# Patient Record
Sex: Female | Born: 1940 | Race: Black or African American | Hispanic: No | Smoking: Never smoker
Health system: Southern US, Community
[De-identification: ages and names within clinical notes are randomized; demographics above are authoritative.]

## PROBLEM LIST (undated history)

## (undated) DIAGNOSIS — F0390 Unspecified dementia without behavioral disturbance: Secondary | ICD-10-CM

## (undated) DIAGNOSIS — I10 Essential (primary) hypertension: Secondary | ICD-10-CM

## (undated) DIAGNOSIS — I639 Cerebral infarction, unspecified: Secondary | ICD-10-CM

## (undated) HISTORY — PX: MASTECTOMY: SHX3

---

## 2005-10-24 ENCOUNTER — Encounter (INDEPENDENT_AMBULATORY_CARE_PROVIDER_SITE_OTHER): Payer: Self-pay | Admitting: *Deleted

## 2005-10-24 LAB — CONVERTED CEMR LAB

## 2006-04-07 ENCOUNTER — Ambulatory Visit: Payer: Self-pay | Admitting: Family Medicine

## 2006-05-09 ENCOUNTER — Encounter: Payer: Self-pay | Admitting: Family Medicine

## 2006-05-09 ENCOUNTER — Ambulatory Visit: Payer: Self-pay | Admitting: Family Medicine

## 2006-05-09 LAB — CONVERTED CEMR LAB
Calcium: 9.5 mg/dL (ref 8.4–10.5)
Sodium: 138 meq/L (ref 135–145)

## 2006-06-23 DIAGNOSIS — I10 Essential (primary) hypertension: Secondary | ICD-10-CM

## 2006-06-23 DIAGNOSIS — D509 Iron deficiency anemia, unspecified: Secondary | ICD-10-CM

## 2006-06-24 ENCOUNTER — Encounter (INDEPENDENT_AMBULATORY_CARE_PROVIDER_SITE_OTHER): Payer: Self-pay | Admitting: *Deleted

## 2006-06-28 ENCOUNTER — Ambulatory Visit: Payer: Self-pay | Admitting: Family Medicine

## 2006-06-28 DIAGNOSIS — S7400XA Injury of sciatic nerve at hip and thigh level, unspecified leg, initial encounter: Secondary | ICD-10-CM

## 2006-07-05 ENCOUNTER — Telehealth: Payer: Self-pay | Admitting: *Deleted

## 2006-07-07 ENCOUNTER — Ambulatory Visit: Payer: Self-pay | Admitting: Sports Medicine

## 2006-07-07 DIAGNOSIS — M1710 Unilateral primary osteoarthritis, unspecified knee: Secondary | ICD-10-CM | POA: Insufficient documentation

## 2006-08-02 ENCOUNTER — Ambulatory Visit: Payer: Self-pay | Admitting: Sports Medicine

## 2006-08-02 LAB — CONVERTED CEMR LAB: Hgb A1c MFr Bld: 6.7 %

## 2006-09-20 ENCOUNTER — Ambulatory Visit: Payer: Self-pay | Admitting: Sports Medicine

## 2006-09-20 DIAGNOSIS — E119 Type 2 diabetes mellitus without complications: Secondary | ICD-10-CM | POA: Insufficient documentation

## 2006-09-20 DIAGNOSIS — Z8639 Personal history of other endocrine, nutritional and metabolic disease: Secondary | ICD-10-CM | POA: Insufficient documentation

## 2006-09-20 LAB — CONVERTED CEMR LAB
Albumin: 4.1 g/dL (ref 3.5–5.2)
BUN: 13 mg/dL (ref 6–23)
CO2: 28 meq/L (ref 19–32)
Calcium: 9.4 mg/dL (ref 8.4–10.5)
Chloride: 103 meq/L (ref 96–112)
Cholesterol: 170 mg/dL (ref 0–200)
Creatinine, Ser: 1.07 mg/dL (ref 0.40–1.20)
HDL: 37 mg/dL — ABNORMAL LOW (ref >39)
Hemoglobin: 10.5 g/dL
Total CHOL/HDL Ratio: 4.6
WBC: 5.6 10*3/uL

## 2006-09-27 ENCOUNTER — Encounter (INDEPENDENT_AMBULATORY_CARE_PROVIDER_SITE_OTHER): Payer: Self-pay | Admitting: Sports Medicine

## 2006-10-12 ENCOUNTER — Telehealth (INDEPENDENT_AMBULATORY_CARE_PROVIDER_SITE_OTHER): Payer: Self-pay | Admitting: Sports Medicine

## 2006-10-13 ENCOUNTER — Telehealth: Payer: Self-pay | Admitting: *Deleted

## 2006-10-14 ENCOUNTER — Ambulatory Visit (HOSPITAL_COMMUNITY): Admission: RE | Admit: 2006-10-14 | Discharge: 2006-10-14 | Payer: Self-pay | Admitting: Family Medicine

## 2006-10-14 ENCOUNTER — Ambulatory Visit: Payer: Self-pay | Admitting: Family Medicine

## 2006-10-27 ENCOUNTER — Encounter: Payer: Self-pay | Admitting: Family Medicine

## 2006-12-05 ENCOUNTER — Encounter (INDEPENDENT_AMBULATORY_CARE_PROVIDER_SITE_OTHER): Payer: Self-pay | Admitting: *Deleted

## 2007-01-23 ENCOUNTER — Ambulatory Visit: Payer: Self-pay | Admitting: Family Medicine

## 2007-01-23 LAB — CONVERTED CEMR LAB: Hgb A1c MFr Bld: 6.4 %

## 2007-03-01 ENCOUNTER — Ambulatory Visit: Payer: Self-pay | Admitting: Family Medicine

## 2007-03-01 LAB — CONVERTED CEMR LAB
ALT: 9 units/L (ref 0–35)
Albumin: 3.9 g/dL (ref 3.5–5.2)
BUN: 15 mg/dL (ref 6–23)
CO2: 26 meq/L (ref 19–32)
Calcium: 9.3 mg/dL (ref 8.4–10.5)
Chloride: 100 meq/L (ref 96–112)
Creatinine, Ser: 1.09 mg/dL (ref 0.40–1.20)
Potassium: 4 meq/L (ref 3.5–5.3)

## 2007-03-06 ENCOUNTER — Encounter: Payer: Self-pay | Admitting: Family Medicine

## 2007-03-15 ENCOUNTER — Telehealth: Payer: Self-pay | Admitting: Family Medicine

## 2007-03-28 ENCOUNTER — Encounter: Payer: Self-pay | Admitting: Family Medicine

## 2007-03-29 ENCOUNTER — Encounter: Payer: Self-pay | Admitting: *Deleted

## 2007-03-30 ENCOUNTER — Telehealth (INDEPENDENT_AMBULATORY_CARE_PROVIDER_SITE_OTHER): Payer: Self-pay | Admitting: *Deleted

## 2007-05-03 ENCOUNTER — Ambulatory Visit: Payer: Self-pay | Admitting: Family Medicine

## 2007-05-03 LAB — CONVERTED CEMR LAB: Hgb A1c MFr Bld: 7.1 %

## 2007-07-26 ENCOUNTER — Ambulatory Visit: Payer: Self-pay | Admitting: Family Medicine

## 2007-07-26 LAB — CONVERTED CEMR LAB
BUN: 13 mg/dL (ref 6–23)
Calcium: 9.4 mg/dL (ref 8.4–10.5)
Chloride: 104 meq/L (ref 96–112)
Creatinine, Ser: 0.9 mg/dL (ref 0.40–1.20)

## 2007-07-28 ENCOUNTER — Encounter: Payer: Self-pay | Admitting: Family Medicine

## 2007-08-11 ENCOUNTER — Ambulatory Visit (HOSPITAL_COMMUNITY): Admission: RE | Admit: 2007-08-11 | Discharge: 2007-08-11 | Payer: Self-pay | Admitting: Family Medicine

## 2007-08-22 ENCOUNTER — Encounter: Admission: RE | Admit: 2007-08-22 | Discharge: 2007-08-22 | Payer: Self-pay | Admitting: Family Medicine

## 2008-01-03 ENCOUNTER — Encounter: Payer: Self-pay | Admitting: *Deleted

## 2008-03-26 ENCOUNTER — Encounter: Payer: Self-pay | Admitting: Family Medicine

## 2008-04-23 ENCOUNTER — Encounter: Payer: Self-pay | Admitting: Family Medicine

## 2013-01-23 ENCOUNTER — Other Ambulatory Visit: Payer: Self-pay | Admitting: Internal Medicine

## 2013-01-23 DIAGNOSIS — I69919 Unspecified symptoms and signs involving cognitive functions following unspecified cerebrovascular disease: Secondary | ICD-10-CM

## 2013-02-05 ENCOUNTER — Telehealth: Payer: Self-pay | Admitting: *Deleted

## 2013-02-05 NOTE — Telephone Encounter (Signed)
Left message for Francee Piccolo at referring to let him know that I am unable to reach the patient and requested for some direction on another number or another way of contacting her.

## 2013-02-22 ENCOUNTER — Ambulatory Visit
Admission: RE | Admit: 2013-02-22 | Discharge: 2013-02-22 | Disposition: A | Discharge status: Home / Self Care | Payer: Medicare Other | Source: Ambulatory Visit | Attending: Internal Medicine | Admitting: Internal Medicine

## 2013-02-22 DIAGNOSIS — I69919 Unspecified symptoms and signs involving cognitive functions following unspecified cerebrovascular disease: Secondary | ICD-10-CM

## 2013-04-05 ENCOUNTER — Telehealth: Payer: Self-pay | Admitting: Hematology and Oncology

## 2013-04-05 NOTE — Telephone Encounter (Signed)
Called pt to schedule np appt. Not able to leave message [phone] not able to leave message [phone]-D/C [phone] no message left.

## 2015-02-05 ENCOUNTER — Other Ambulatory Visit: Payer: Self-pay | Admitting: Internal Medicine

## 2015-02-05 DIAGNOSIS — Z9889 Other specified postprocedural states: Secondary | ICD-10-CM

## 2015-02-05 DIAGNOSIS — Z853 Personal history of malignant neoplasm of breast: Secondary | ICD-10-CM

## 2020-03-03 ENCOUNTER — Encounter (HOSPITAL_COMMUNITY): Payer: Self-pay | Admitting: Emergency Medicine

## 2020-03-03 ENCOUNTER — Emergency Department (HOSPITAL_COMMUNITY): Payer: Medicare (Managed Care)

## 2020-03-03 ENCOUNTER — Emergency Department (HOSPITAL_COMMUNITY)
Admission: EM | Admit: 2020-03-03 | Discharge: 2020-03-03 | Disposition: A | Discharge status: Home / Self Care | Payer: Medicare (Managed Care) | Attending: Emergency Medicine | Admitting: Emergency Medicine

## 2020-03-03 DIAGNOSIS — I639 Cerebral infarction, unspecified: Secondary | ICD-10-CM | POA: Insufficient documentation

## 2020-03-03 DIAGNOSIS — R0989 Other specified symptoms and signs involving the circulatory and respiratory systems: Secondary | ICD-10-CM | POA: Insufficient documentation

## 2020-03-03 DIAGNOSIS — F0390 Unspecified dementia without behavioral disturbance: Secondary | ICD-10-CM | POA: Insufficient documentation

## 2020-03-03 DIAGNOSIS — I10 Essential (primary) hypertension: Secondary | ICD-10-CM | POA: Diagnosis not present

## 2020-03-03 DIAGNOSIS — E119 Type 2 diabetes mellitus without complications: Secondary | ICD-10-CM | POA: Insufficient documentation

## 2020-03-03 DIAGNOSIS — Z20822 Contact with and (suspected) exposure to covid-19: Secondary | ICD-10-CM | POA: Diagnosis not present

## 2020-03-03 HISTORY — DX: Cerebral infarction, unspecified: I63.9

## 2020-03-03 HISTORY — DX: Unspecified dementia, unspecified severity, without behavioral disturbance, psychotic disturbance, mood disturbance, and anxiety: F03.90

## 2020-03-03 HISTORY — DX: Essential (primary) hypertension: I10

## 2020-03-03 LAB — BASIC METABOLIC PANEL
Anion gap: 12 (ref 5–15)
BUN: 26 mg/dL — ABNORMAL HIGH (ref 8–23)
CO2: 18 mmol/L — ABNORMAL LOW (ref 22–32)
Calcium: 8.3 mg/dL — ABNORMAL LOW (ref 8.9–10.3)
Chloride: 109 mmol/L (ref 98–111)
Creatinine, Ser: 1.26 mg/dL — ABNORMAL HIGH (ref 0.44–1.00)
GFR, Estimated: 44 mL/min — ABNORMAL LOW (ref >60)
Glucose, Bld: 105 mg/dL — ABNORMAL HIGH (ref 70–99)
Potassium: 4.3 mmol/L (ref 3.5–5.1)
Sodium: 139 mmol/L (ref 135–145)

## 2020-03-03 LAB — RESPIRATORY PANEL BY RT PCR (FLU A&B, COVID)
Influenza A by PCR: NEGATIVE
Influenza B by PCR: NEGATIVE
SARS Coronavirus 2 by RT PCR: NEGATIVE

## 2020-03-03 LAB — CBC WITH DIFFERENTIAL/PLATELET
Abs Immature Granulocytes: 0.03 10*3/uL (ref 0.00–0.07)
Basophils Absolute: 0 10*3/uL (ref 0.0–0.1)
Basophils Relative: 0 %
Eosinophils Absolute: 0.1 10*3/uL (ref 0.0–0.5)
Eosinophils Relative: 2 %
HCT: 29.1 % — ABNORMAL LOW (ref 36.0–46.0)
Hemoglobin: 8.1 g/dL — ABNORMAL LOW (ref 12.0–15.0)
Immature Granulocytes: 1 %
Lymphocytes Relative: 12 %
Lymphs Abs: 0.7 10*3/uL (ref 0.7–4.0)
MCH: 24.3 pg — ABNORMAL LOW (ref 26.0–34.0)
MCHC: 27.8 g/dL — ABNORMAL LOW (ref 30.0–36.0)
MCV: 87.1 fL (ref 80.0–100.0)
Monocytes Absolute: 0.4 10*3/uL (ref 0.1–1.0)
Monocytes Relative: 6 %
Neutro Abs: 4.9 10*3/uL (ref 1.7–7.7)
Neutrophils Relative %: 79 %
Platelets: 198 10*3/uL (ref 150–400)
RBC: 3.34 MIL/uL — ABNORMAL LOW (ref 3.87–5.11)
RDW: 15.1 % (ref 11.5–15.5)
WBC: 6.1 10*3/uL (ref 4.0–10.5)
nRBC: 0 % (ref 0.0–0.2)

## 2020-03-03 NOTE — ED Provider Notes (Signed)
Emergency Department Provider Note   I have reviewed the triage vital signs and the nursing notes.   HISTORY  Chief Complaint Choking   HPI Sandra Jennings is a 79 y.o. female Dementia, HTN, and prior CVA presents to the ED with choking while eating dinner tonight.  The patient has a history of dementia and history is limited based on this.  Level 5 caveat applies.  The patient's son is at bedside and provides most of the history.  He tells me that they were eating a rice with beef tips tonight and the patient was eating alone.  They heard her gasping and choking and went to assist her.  She spit out the food that was in her mouth but then continued to spit her saliva out.  She did not appear short of breath.  Ultimately, they decided to call EMS for evaluation where she continued to spit out her saliva in route.     Past Medical History:  Diagnosis Date  . Dementia (HCC)   . Hypertension   . Stroke [facility])     Patient Active Problem List   Diagnosis Date Noted  . DIABETES MELLITUS, TYPE II 09/20/2006  . DEGENERATIVE JOINT DISEASE, KNEES, BILATERAL 07/07/2006  . 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 ANEMIA, IRON DEFICIENCY, UNSPEC. 06/23/2006  . HYPERTENSION, BENIGN SYSTEMIC 06/23/2006   Allergies Patient has no allergy information on record.  No family history on file.  Social History Social History   Tobacco Use  . Smoking status: Not on file  Substance Use Topics  . Alcohol use: Not on file  . Drug use: Not on file    Review of Systems  Level 5 caveat: Dementia   ____________________________________________   PHYSICAL EXAM:  VITAL SIGNS: ED Triage Vitals  Enc Vitals Group     BP 03/03/20 1837 (!) 151/81     Pulse Rate 03/03/20 1837 78     Resp 03/03/20 1837 17     Temp 03/03/20 1837 98.4 F (36.9 C)     Temp Source 03/03/20 1837 Oral     SpO2 03/03/20 1837 100 %     Weight 03/03/20 1838 257 lb 15 oz (117 kg)     Height 03/03/20 1838 5\' 3"  (1.6 m)    Constitutional: Alert but unable to provide significant history. Well appearing and in no acute distress. Eyes: Conjunctivae are normal. Head: Atraumatic. Nose: No congestion/rhinnorhea. Mouth/Throat: Mucous membranes are moist.  Oropharynx is widely patent with no pooling secretions. Neck: No stridor.  Cardiovascular: Normal rate, regular rhythm. Good peripheral circulation. Grossly normal heart sounds.   Respiratory: Normal respiratory effort.  No retractions. Lungs CTAB. Gastrointestinal: No distention.  Musculoskeletal: No gross deformities of extremities. Neurologic:  Normal speech and language. Skin:  Skin is warm, dry and intact. No rash noted.   ____________________________________________   LABS (all labs ordered are listed, but only abnormal results are displayed)  Labs Reviewed  BASIC METABOLIC PANEL - Abnormal; Notable for the following components:      Result Value   CO2 18 (*)    Glucose, Bld 105 (*)    BUN 26 (*)    Creatinine, Ser 1.26 (*)    Calcium 8.3 (*)    GFR, Estimated 44 (*)    All other components within normal limits  CBC WITH DIFFERENTIAL/PLATELET - Abnormal; Notable for the following components:   RBC 3.34 (*)    Hemoglobin 8.1 (*)    HCT 29.1 (*)    [facility]  24.3 (*)    MCHC 27.8 (*)    All other components within normal limits  RESPIRATORY PANEL BY RT PCR (FLU A&B, COVID)   ____________________________________________  RADIOLOGY  DG Chest 2 View  Result Date: 03/03/2020 CLINICAL DATA:  Choking. EXAM: CHEST - 2 VIEW COMPARISON:  None. FINDINGS: The lungs are clear without focal pneumonia, edema, pneumothorax or pleural effusion. Interstitial markings are diffusely coarsened with chronic features. Cardiopericardial silhouette is at upper limits of normal for size. Bones are diffusely demineralized. IMPRESSION: No active cardiopulmonary disease. Electronically Signed   By: Kennith Center M.D.   On: 03/03/2020 19:10     ____________________________________________   PROCEDURES  Procedure(s) performed:   Procedures  None  ____________________________________________   INITIAL IMPRESSION / ASSESSMENT AND PLAN / ED COURSE  Pertinent labs & imaging results that were available during my care of the patient were reviewed by me and considered in my medical decision making (see chart for details).   Patient presents to the emergency department with clinical picture consistent with esophageal food impaction.  She is in no respiratory distress at this time.  She does have a emesis bag with her at the bedside which is filled mainly with saliva.  Plan for chest x-ray and lab work and will discuss with GI.   CXR without acute findings. Patient drinking water here without difficulty. No vomiting or spitting. Will continue to observe pending labs. Will try apple sauce consistency food.   Patient eating and drinking without difficulty.  No vomiting or spitting up.  Suspect that she had a food impaction which cleared.  Discussed with the son that she may have some underlying esophageal strictures and may benefit from evaluation by gastroenterology.  Provided the clinic contact information for the practice on-call.  Advised the patient also follow closely with her primary care doctor.  In the meantime she will stick with soft foods and liquids primarily. Discussed ED return precautions.   Lab work reviewed showing creatinine and BUN elevated from her prior values but those are from over 10 years ago.  Patient does not appear acutely dehydrated.  She is drinking fluids and plans to follow with the primary care doctor.  Covid negative.  ____________________________________________  FINAL CLINICAL IMPRESSION(S) / ED DIAGNOSES  Final diagnoses:  Choking episode     Note:  This document was prepared using Dragon voice recognition software and may include unintentional dictation errors.  Alona Bene, MD,  [facility]    Silva Aamodt, Arlyss Repress, MD 03/03/20 2106

## 2020-03-03 NOTE — ED Triage Notes (Signed)
Pt arrives via EMS from home with hx of HTN and dementia. Pt was eating dinner and possible choked on rice and beef. EMS reports pt constantly spit up white frothy sputum en route.

## 2020-03-03 NOTE — Discharge Instructions (Signed)
You were seen in the ED with a choking episode. This was likely from food that got temporarily stuck in your esophagus and then went down on its own. This could mean that you have an area of narrowing in your esophagus. You should follow up with your PCP but also a gastroenterologist. I have listed the name of someone here. Return to the ED with any new or worsening symptoms. Please eat soft foods that are easy to chew until cleared by your PCP or Gastroenterology doctor to do otherwise.

## 2020-03-03 NOTE — ED Notes (Signed)
Pt tolerating a cup of water without any coughing or difficult maintaining fluids.

## 2020-03-03 NOTE — ED Notes (Signed)
Vital signs stable. 

## 2020-03-03 NOTE — ED Notes (Signed)
Pt tolerated applesauce without difficulty

## 2020-04-29 ENCOUNTER — Encounter: Payer: Self-pay | Admitting: Gastroenterology

## 2020-04-29 ENCOUNTER — Ambulatory Visit (INDEPENDENT_AMBULATORY_CARE_PROVIDER_SITE_OTHER): Payer: Medicare (Managed Care) | Admitting: Gastroenterology

## 2020-04-29 VITALS — BP 132/72 | HR 81 | Ht 65.0 in | Wt 124.0 lb

## 2020-04-29 DIAGNOSIS — F0390 Unspecified dementia without behavioral disturbance: Secondary | ICD-10-CM | POA: Diagnosis not present

## 2020-04-29 DIAGNOSIS — T17308D Unspecified foreign body in larynx causing other injury, subsequent encounter: Secondary | ICD-10-CM

## 2020-04-29 DIAGNOSIS — R1310 Dysphagia, unspecified: Secondary | ICD-10-CM | POA: Diagnosis not present

## 2020-04-29 NOTE — Progress Notes (Signed)
HPI: This is a very pleasant 80 year old woman who was referred to me by [employer]  to evaluate an episode of choking, dysphagia.    Sandra Jennings is demented, she is unable to provide any history.  She is here with her son today who she lives with.  Her son is very aware of her various health issues.  She has been demented for years and has been slowly losing weight, the doctors feel it is from her dementia.  She gets around by walking with a lot of assist only.  She is unaware of where she is.  Her son describes a choking event early November that is well documented by the emergency room visit summarized below.  Prior to that choking event and since the choking event her son and her son's wife have never suspected that she is having any trouble with her swallowing.  She drinks 1 can of Ensure every day, she eats solid breakfast sandwiches without assist.  She is eating solid food otherwise without any obvious dysphagia or swallowing issues or nausea or vomiting but her family that lives with her can tell   Old Data Reviewed: She had a choking event March 03, 2020 while eating dinner at her care facility.  She was eating rice and beef tips.  Then she was gasping and choking.  She was eventually brought to the emergency room for evaluation.  In the emergency room they felt that she had "clinical picture consistent with esophageal food impaction" she was in no respiratory distress at that time but was having some difficulty with saliva.  Chest x-ray was normal.  She was eventually drinking water without difficulty and then applesauce and then food.  All of this went down well so she was sent home.  She was told that she "may benefit from evaluation by gastroenterology"  The appt was set up by [facility]   Review of systems: Pertinent positive and negative review of systems were noted in the above HPI section. All other review negative.   Past Medical History:  Diagnosis Date  . Dementia  (HCC)   . Hypertension   . Stroke [facility])     Past Surgical History:  Procedure Laterality Date  . MASTECTOMY Bilateral     Current Outpatient Medications  Medication Sig Dispense Refill  . acetaminophen (TYLENOL) 325 MG tablet Take 650 mg by mouth 3 (three) times daily as needed.    . Iron-Vitamin C (VITRON-C) 65-125 MG TABS Take 1 tablet by mouth daily at 2 PM.    . lisinopril (ZESTRIL) 20 MG tablet Take 20 mg by mouth daily.    . QUEtiapine (SEROQUEL) 100 MG tablet Take 100 mg by mouth 2 (two) times daily.    . sertraline (ZOLOFT) 25 MG tablet Take 25 mg by mouth daily.    . traZODone (DESYREL) 100 MG tablet Take 200 mg by mouth at bedtime.     No current facility-administered medications for this visit.    Allergies as of 04/29/2020 - Review Complete 04/29/2020  Allergen Reaction Noted  . Ambien [zolpidem] Other (See Comments) 04/28/2020    Family History  Problem Relation Age of Onset  . Hypertension Son   . Stomach cancer Neg Hx   . Colon cancer Neg Hx   . Pancreatic cancer Neg Hx     Social History   Socioeconomic History  . Marital status: Widowed    Spouse name: Not on file  . Number of children: Not on  file  . Years of education: Not on file  . Highest education level: Not on file  Occupational History  . Not on file  Tobacco Use  . Smoking status: Never Smoker  . Smokeless tobacco: Never Used  Substance and Sexual Activity  . Alcohol use: Never  . Drug use: Never  . Sexual activity: Not on file  Other Topics Concern  . Not on file  Social History Narrative  . Not on file   Social Determinants of Health   Financial Resource Strain: Not on file  Food Insecurity: Not on file  Transportation Needs: Not on file  Physical Activity: Not on file  Stress: Not on file  Social Connections: Not on file  Intimate Partner Violence: Not on file     Physical Exam: Ht 5\' 5"  (1.651 m)   Wt 124 lb (56.2 kg)   BMI 20.63 kg/m  Constitutional: Demented,  sitting in a wheelchair Psychiatric: alert and oriented x0 Eyes: extraocular movements intact Mouth: oral pharynx moist, no lesions Neck: supple no lymphadenopathy Cardiovascular: heart regular rate and rhythm Lungs: clear to auscultation bilaterally Abdomen: soft, nontender, nondistended, no obvious ascites, no peritoneal signs, normal bowel sounds Extremities: no lower extremity edema bilaterally Skin: no lesions on visible extremities   Assessment and plan: 80 y.o. female with dementia, episode of choking in November  Her son and his wife live with Sandra Jennings and they have never suspected or noticed any sign of swallowing difficulty before this choking event in November or since the choking event.  She is able to eat solid food without any evident difficulty.  She drinks an Ensure every day.  Admittedly she has been losing weight gradually over years.  I explained that I doubt there is anything serious going on since the choking event was a single event only and she never had any before then or since then.  She has no evidence of dysphagia.  I think that she probably simply aspirated a bit of food that one night causing her issues.  She is 54, frail and elderly.  Her son and I agreed that he and his wife would watch for any sign of swallowing trouble and contact me if they feel she is having trouble.  Otherwise we would simply let this be without dedicated testing given the obvious difficulty she would have with any tests invasive or not.   Please see the "Patient Instructions" section for addition details about the plan.   71, MD [facility] 04/29/2020, 11:20 AM  Cc: [employer], 06/27/2020 A*  Total time on date of encounter was 45  minutes (this included time spent preparing to see the patient reviewing records; obtaining and/or reviewing separately obtained history; performing a medically appropriate exam and/or evaluation; counseling and educating the patient and  family if present; ordering medications, tests or procedures if applicable; and documenting clinical information in the health record).

## 2020-04-29 NOTE — Patient Instructions (Signed)
If you are age 79 or older, your body mass index should be between 23-30. Your Body mass index is 20.63 kg/m. If this is out of the aforementioned range listed, please consider follow up with your Primary Care Provider.  Family will contact our office at [phone] if they feel patient is having repeat episodes of trouble swallowing to make a follow up appointment.  Thank you for entrusting me with your care and choosing [facility].  Dr Christella Hartigan

## 2020-09-18 ENCOUNTER — Other Ambulatory Visit: Payer: Self-pay | Admitting: Vascular Surgery

## 2020-09-18 ENCOUNTER — Ambulatory Visit
Admission: RE | Admit: 2020-09-18 | Discharge: 2020-09-18 | Disposition: A | Discharge status: Home / Self Care | Payer: Medicare (Managed Care) | Source: Ambulatory Visit | Attending: Vascular Surgery | Admitting: Vascular Surgery

## 2020-09-18 ENCOUNTER — Other Ambulatory Visit: Payer: Self-pay

## 2020-09-18 DIAGNOSIS — M79632 Pain in left forearm: Secondary | ICD-10-CM

## 2020-09-18 DIAGNOSIS — M25442 Effusion, left hand: Secondary | ICD-10-CM

## 2021-11-05 IMAGING — DX DG HAND COMPLETE 3+V*L*
4 series · 4 of 4 positions shown · non-contrast
Comparison: None.

CLINICAL DATA: Left hand pain and swelling.  Possible injury.

EXAM:
LEFT HAND - COMPLETE 3+ VIEW

[dg hand complete left (1 of 4)]
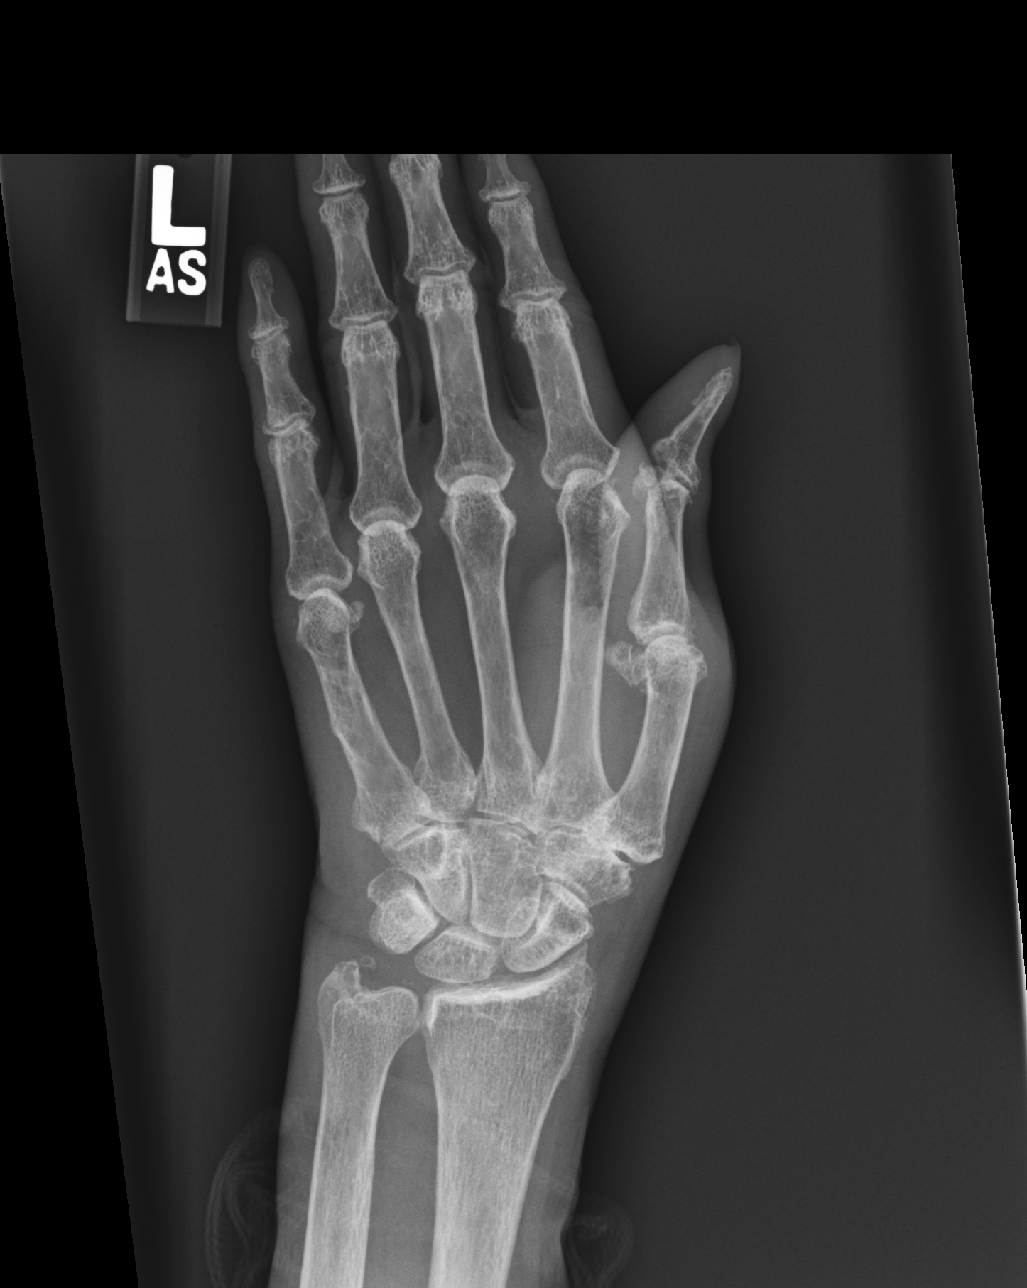

[dg hand complete left (2 of 4)]
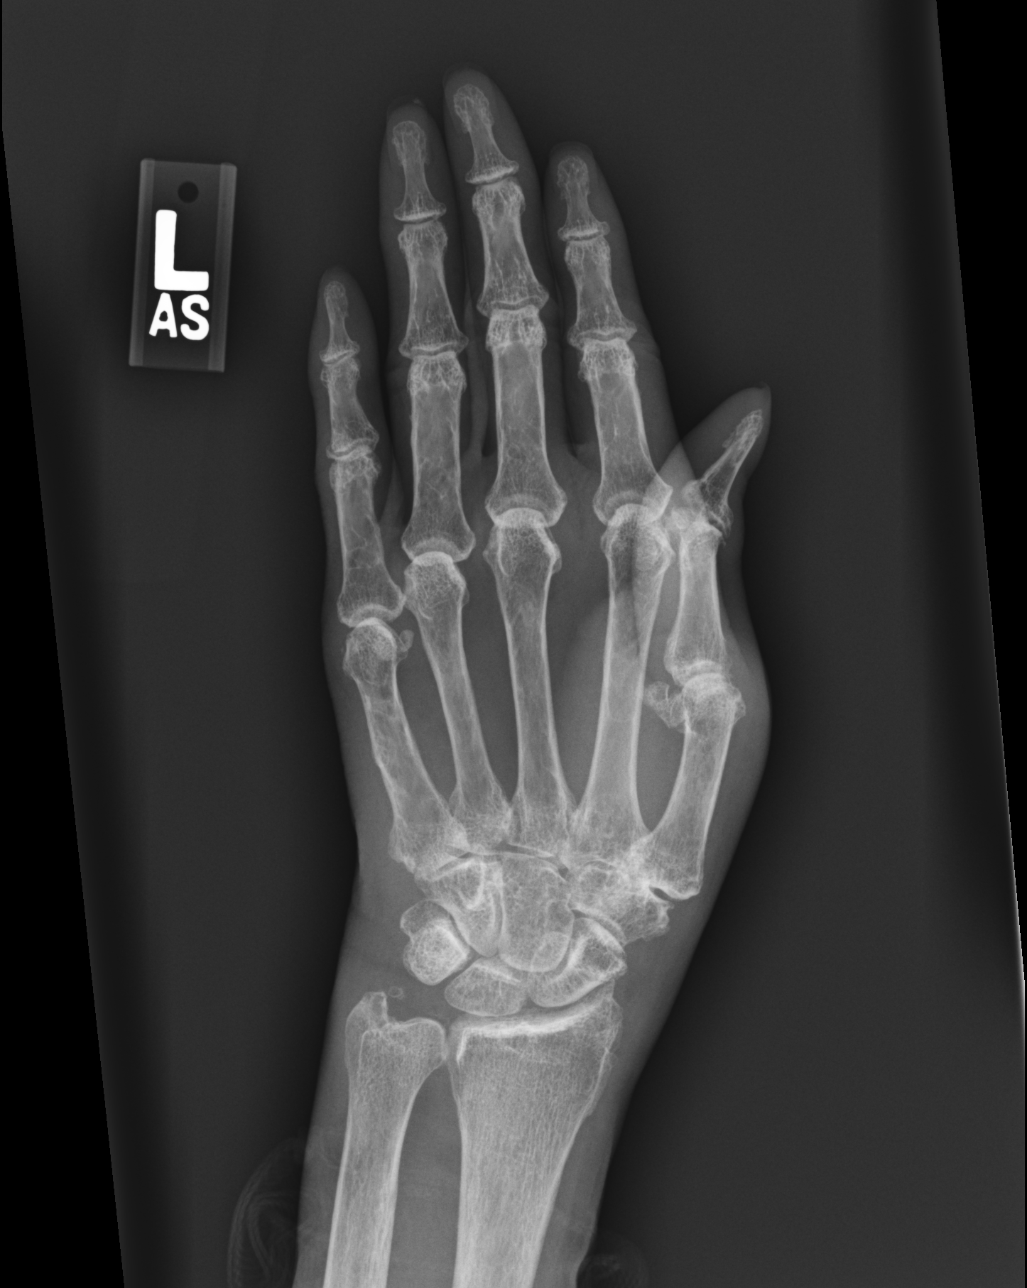

[dg hand complete left (3 of 4)]
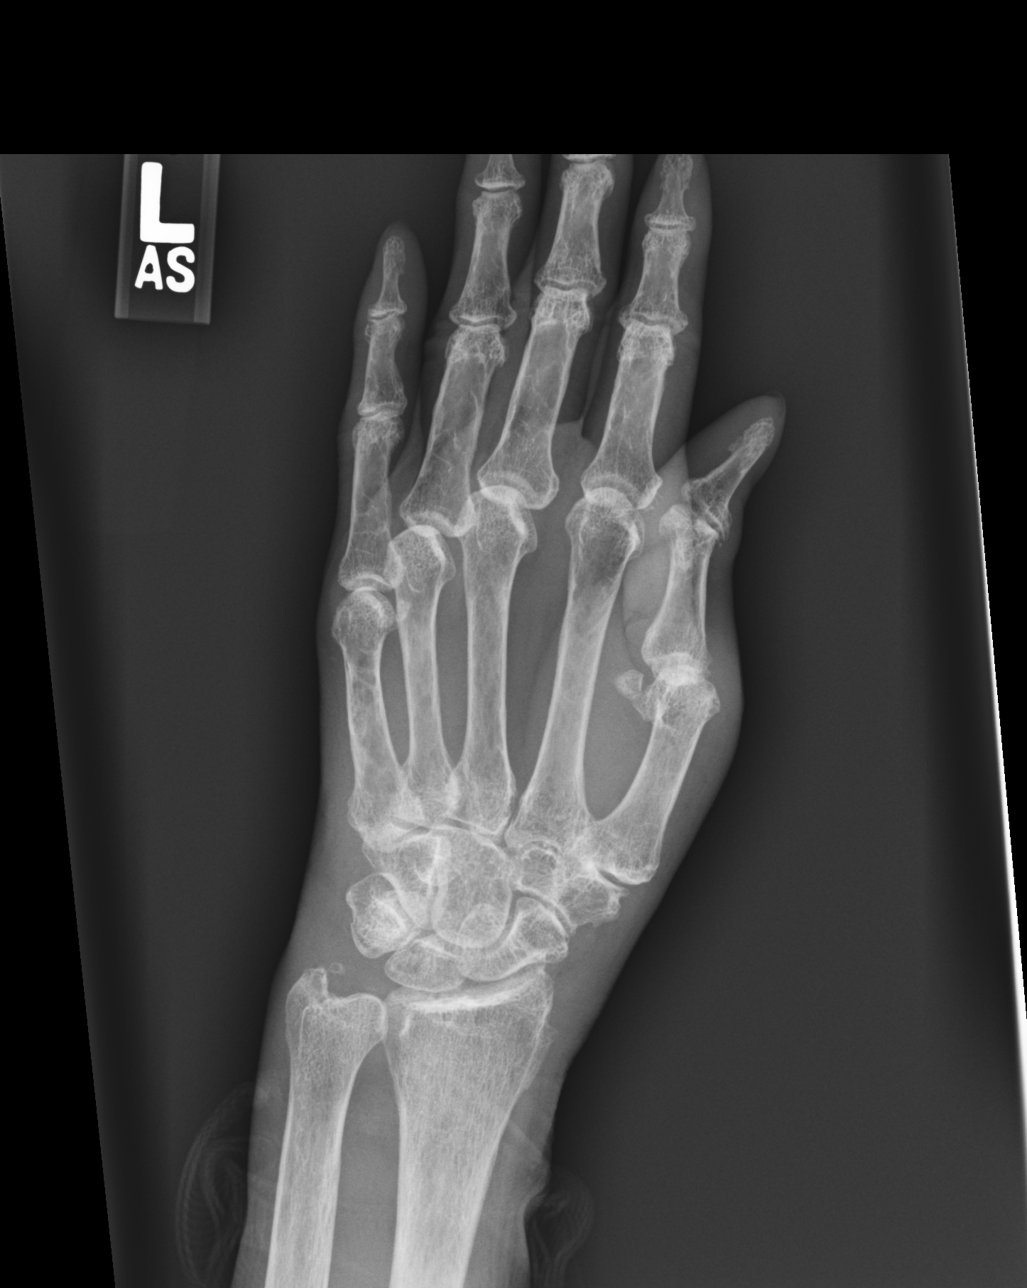

[dg hand complete left (4 of 4)]
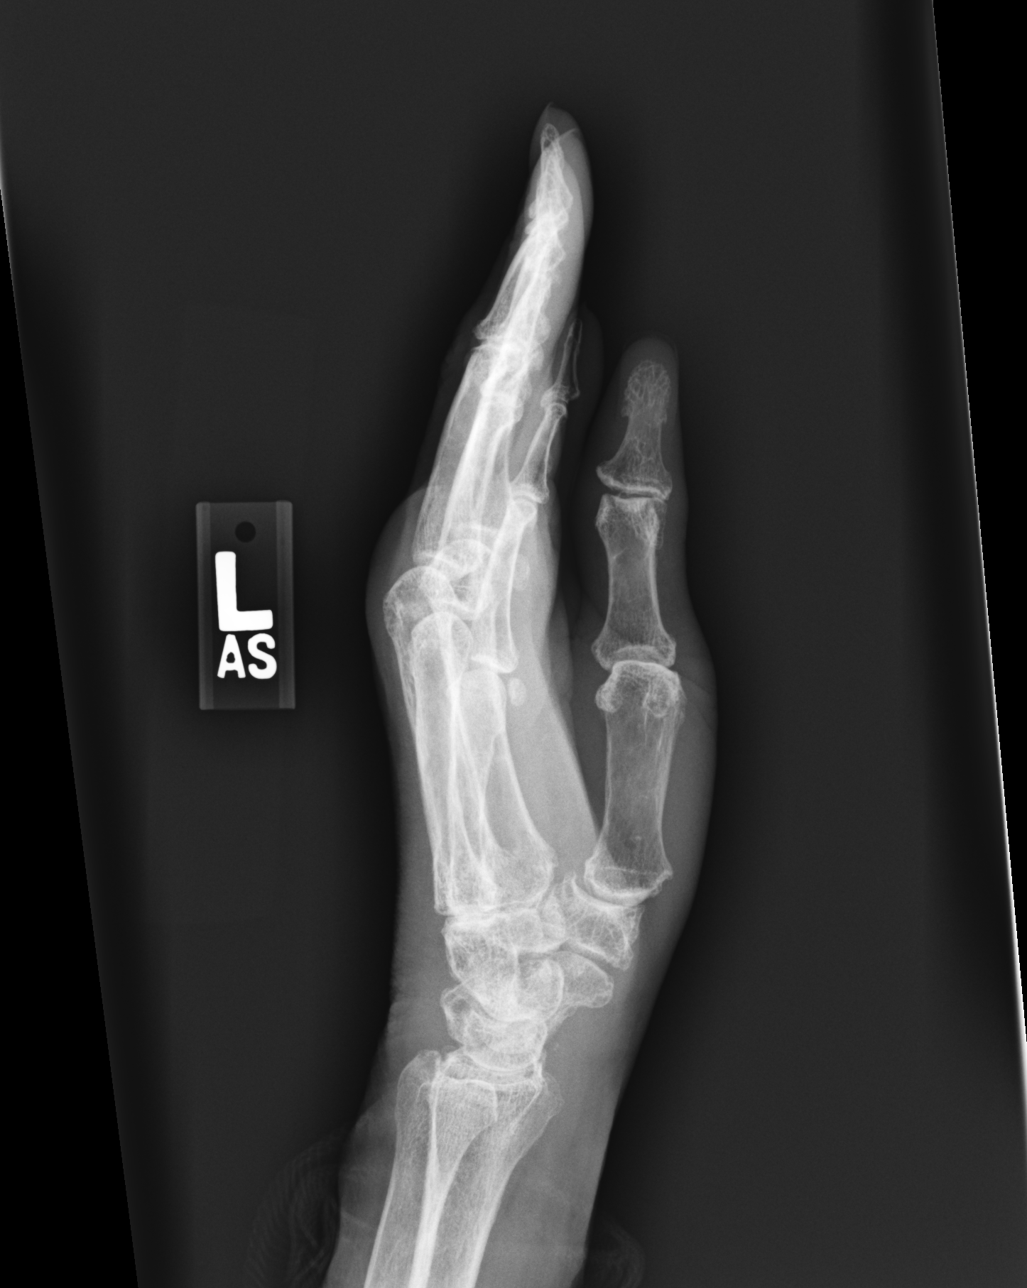

[4 of 4 positions shown; findings below may reference images not displayed]

FINDINGS: There is no evidence of fracture or dislocation. Multifocal
osteoarthritis throughout the digits, predominantly affecting the
thumb. There is radiocarpal joint space narrowing. No erosion or
bone destruction. No focal soft tissue abnormality is seen.
IMPRESSION: 1. No acute findings.
2. Multifocal osteoarthritis, predominantly affecting the thumb.

## 2022-01-03 ENCOUNTER — Other Ambulatory Visit: Payer: Self-pay

## 2022-01-03 ENCOUNTER — Encounter (HOSPITAL_COMMUNITY): Payer: Self-pay

## 2022-01-03 ENCOUNTER — Emergency Department (HOSPITAL_COMMUNITY)
Admission: EM | Admit: 2022-01-03 | Discharge: 2022-01-03 | Discharge status: Against Medical Advice | Payer: Medicare (Managed Care) | Attending: Emergency Medicine | Admitting: Emergency Medicine

## 2022-01-03 DIAGNOSIS — Z5321 Procedure and treatment not carried out due to patient leaving prior to being seen by health care provider: Secondary | ICD-10-CM | POA: Insufficient documentation

## 2022-01-03 DIAGNOSIS — R197 Diarrhea, unspecified: Secondary | ICD-10-CM | POA: Diagnosis present

## 2022-01-03 LAB — COMPREHENSIVE METABOLIC PANEL
ALT: 43 U/L (ref 0–44)
AST: 134 U/L — ABNORMAL HIGH (ref 15–41)
Albumin: 3.1 g/dL — ABNORMAL LOW (ref 3.5–5.0)
Alkaline Phosphatase: 122 U/L (ref 38–126)
Anion gap: 8 (ref 5–15)
BUN: 38 mg/dL — ABNORMAL HIGH (ref 8–23)
CO2: 24 mmol/L (ref 22–32)
Calcium: 8.7 mg/dL — ABNORMAL LOW (ref 8.9–10.3)
Chloride: 108 mmol/L (ref 98–111)
Creatinine, Ser: 1.75 mg/dL — ABNORMAL HIGH (ref 0.44–1.00)
GFR, Estimated: 29 mL/min — ABNORMAL LOW (ref >60)
Glucose, Bld: 156 mg/dL — ABNORMAL HIGH (ref 70–99)
Potassium: 4.2 mmol/L (ref 3.5–5.1)
Sodium: 140 mmol/L (ref 135–145)
Total Bilirubin: 0.6 mg/dL (ref 0.3–1.2)
Total Protein: 6.9 g/dL (ref 6.5–8.1)

## 2022-01-03 LAB — CBC
HCT: 28.5 % — ABNORMAL LOW (ref 36.0–46.0)
Hemoglobin: 8.9 g/dL — ABNORMAL LOW (ref 12.0–15.0)
MCH: 24.6 pg — ABNORMAL LOW (ref 26.0–34.0)
MCHC: 31.2 g/dL (ref 30.0–36.0)
MCV: 78.7 fL — ABNORMAL LOW (ref 80.0–100.0)
Platelets: 149 10*3/uL — ABNORMAL LOW (ref 150–400)
RBC: 3.62 MIL/uL — ABNORMAL LOW (ref 3.87–5.11)
RDW: 15.6 % — ABNORMAL HIGH (ref 11.5–15.5)
WBC: 10.1 10*3/uL (ref 4.0–10.5)
nRBC: 0 % (ref 0.0–0.2)

## 2022-01-03 LAB — TYPE AND SCREEN
ABO/RH(D): B POS
Antibody Screen: NEGATIVE

## 2022-01-03 NOTE — ED Triage Notes (Addendum)
Patient checked in for diarrhea but is rocking back and forth and will not speak.   Per family patient has been havving diarrhea that is black x 2 days Patient is not on blood thinner.

## 2022-01-03 NOTE — ED Notes (Signed)
Patient was moving when previous sat and HR obtained,

## 2022-01-03 NOTE — ED Notes (Signed)
Pt son said he will be taking pt home, due to wait time.Pt son said he will take her to primary doctor tomorrow.  Pt leaving AMA

## 2022-02-28 ENCOUNTER — Emergency Department (HOSPITAL_COMMUNITY): Payer: Medicare (Managed Care)

## 2022-02-28 ENCOUNTER — Encounter (HOSPITAL_COMMUNITY): Payer: Self-pay | Admitting: Emergency Medicine

## 2022-02-28 ENCOUNTER — Other Ambulatory Visit: Payer: Self-pay

## 2022-02-28 ENCOUNTER — Inpatient Hospital Stay (HOSPITAL_COMMUNITY)
Admission: EM | Admit: 2022-02-28 | Discharge: 2022-03-04 | DRG: 378 | Disposition: A | Discharge status: Hospice | Payer: Medicare (Managed Care) | Attending: Internal Medicine | Admitting: Internal Medicine

## 2022-02-28 DIAGNOSIS — G309 Alzheimer's disease, unspecified: Secondary | ICD-10-CM | POA: Diagnosis present

## 2022-02-28 DIAGNOSIS — K922 Gastrointestinal hemorrhage, unspecified: Secondary | ICD-10-CM | POA: Diagnosis not present

## 2022-02-28 DIAGNOSIS — D500 Iron deficiency anemia secondary to blood loss (chronic): Secondary | ICD-10-CM | POA: Diagnosis present

## 2022-02-28 DIAGNOSIS — Z8673 Personal history of transient ischemic attack (TIA), and cerebral infarction without residual deficits: Secondary | ICD-10-CM

## 2022-02-28 DIAGNOSIS — R160 Hepatomegaly, not elsewhere classified: Secondary | ICD-10-CM | POA: Diagnosis present

## 2022-02-28 DIAGNOSIS — R64 Cachexia: Secondary | ICD-10-CM | POA: Diagnosis present

## 2022-02-28 DIAGNOSIS — Z681 Body mass index (BMI) 19 or less, adult: Secondary | ICD-10-CM

## 2022-02-28 DIAGNOSIS — F02C3 Dementia in other diseases classified elsewhere, severe, with mood disturbance: Secondary | ICD-10-CM | POA: Diagnosis present

## 2022-02-28 DIAGNOSIS — Z888 Allergy status to other drugs, medicaments and biological substances status: Secondary | ICD-10-CM

## 2022-02-28 DIAGNOSIS — R627 Adult failure to thrive: Secondary | ICD-10-CM | POA: Diagnosis present

## 2022-02-28 DIAGNOSIS — R55 Syncope and collapse: Secondary | ICD-10-CM | POA: Diagnosis present

## 2022-02-28 DIAGNOSIS — Z515 Encounter for palliative care: Secondary | ICD-10-CM

## 2022-02-28 DIAGNOSIS — E119 Type 2 diabetes mellitus without complications: Secondary | ICD-10-CM | POA: Diagnosis present

## 2022-02-28 DIAGNOSIS — L89212 Pressure ulcer of right hip, stage 2: Secondary | ICD-10-CM | POA: Diagnosis present

## 2022-02-28 DIAGNOSIS — Z66 Do not resuscitate: Secondary | ICD-10-CM | POA: Diagnosis not present

## 2022-02-28 DIAGNOSIS — L89152 Pressure ulcer of sacral region, stage 2: Secondary | ICD-10-CM | POA: Diagnosis present

## 2022-02-28 DIAGNOSIS — D649 Anemia, unspecified: Principal | ICD-10-CM | POA: Diagnosis present

## 2022-02-28 DIAGNOSIS — R636 Underweight: Secondary | ICD-10-CM | POA: Diagnosis present

## 2022-02-28 DIAGNOSIS — Z79899 Other long term (current) drug therapy: Secondary | ICD-10-CM

## 2022-02-28 DIAGNOSIS — I10 Essential (primary) hypertension: Secondary | ICD-10-CM | POA: Diagnosis present

## 2022-02-28 DIAGNOSIS — R0902 Hypoxemia: Secondary | ICD-10-CM | POA: Diagnosis not present

## 2022-02-28 DIAGNOSIS — Z8249 Family history of ischemic heart disease and other diseases of the circulatory system: Secondary | ICD-10-CM

## 2022-02-28 DIAGNOSIS — Z8639 Personal history of other endocrine, nutritional and metabolic disease: Secondary | ICD-10-CM

## 2022-02-28 DIAGNOSIS — K921 Melena: Secondary | ICD-10-CM | POA: Diagnosis not present

## 2022-02-28 DIAGNOSIS — Z9882 Breast implant status: Secondary | ICD-10-CM

## 2022-02-28 DIAGNOSIS — D509 Iron deficiency anemia, unspecified: Secondary | ICD-10-CM | POA: Diagnosis present

## 2022-02-28 DIAGNOSIS — L8990 Pressure ulcer of unspecified site, unspecified stage: Secondary | ICD-10-CM | POA: Diagnosis present

## 2022-02-28 DIAGNOSIS — N6320 Unspecified lump in the left breast, unspecified quadrant: Secondary | ICD-10-CM | POA: Diagnosis present

## 2022-02-28 DIAGNOSIS — Z9013 Acquired absence of bilateral breasts and nipples: Secondary | ICD-10-CM

## 2022-02-28 LAB — CBC
HCT: 19.4 % — ABNORMAL LOW (ref 36.0–46.0)
HCT: 17.9 % — ABNORMAL LOW (ref 36.0–46.0)
Hemoglobin: 5.7 g/dL — CL (ref 12.0–15.0)
Hemoglobin: 5.4 g/dL — CL (ref 12.0–15.0)
MCH: 24.3 pg — ABNORMAL LOW (ref 26.0–34.0)
MCH: 24.4 pg — ABNORMAL LOW (ref 26.0–34.0)
MCHC: 29.4 g/dL — ABNORMAL LOW (ref 30.0–36.0)
MCHC: 30.2 g/dL (ref 30.0–36.0)
MCV: 82.6 fL (ref 80.0–100.0)
MCV: 81 fL (ref 80.0–100.0)
Platelets: 220 10*3/uL (ref 150–400)
Platelets: 197 10*3/uL (ref 150–400)
RBC: 2.35 MIL/uL — ABNORMAL LOW (ref 3.87–5.11)
RBC: 2.21 MIL/uL — ABNORMAL LOW (ref 3.87–5.11)
RDW: 17.3 % — ABNORMAL HIGH (ref 11.5–15.5)
RDW: 17.2 % — ABNORMAL HIGH (ref 11.5–15.5)
WBC: 4.8 10*3/uL (ref 4.0–10.5)
WBC: 5.4 10*3/uL (ref 4.0–10.5)
nRBC: 0 % (ref 0.0–0.2)
nRBC: 0 % (ref 0.0–0.2)

## 2022-02-28 LAB — COMPREHENSIVE METABOLIC PANEL
ALT: 24 U/L (ref 0–44)
AST: 48 U/L — ABNORMAL HIGH (ref 15–41)
Albumin: 3 g/dL — ABNORMAL LOW (ref 3.5–5.0)
Alkaline Phosphatase: 86 U/L (ref 38–126)
Anion gap: 6 (ref 5–15)
BUN: 36 mg/dL — ABNORMAL HIGH (ref 8–23)
CO2: 23 mmol/L (ref 22–32)
Calcium: 8.8 mg/dL — ABNORMAL LOW (ref 8.9–10.3)
Chloride: 114 mmol/L — ABNORMAL HIGH (ref 98–111)
Creatinine, Ser: 1.22 mg/dL — ABNORMAL HIGH (ref 0.44–1.00)
GFR, Estimated: 45 mL/min — ABNORMAL LOW (ref >60)
Glucose, Bld: 112 mg/dL — ABNORMAL HIGH (ref 70–99)
Potassium: 4.4 mmol/L (ref 3.5–5.1)
Sodium: 143 mmol/L (ref 135–145)
Total Bilirubin: 0.5 mg/dL (ref 0.3–1.2)
Total Protein: 6.2 g/dL — ABNORMAL LOW (ref 6.5–8.1)

## 2022-02-28 LAB — POC OCCULT BLOOD, ED: Fecal Occult Bld: POSITIVE — AB

## 2022-02-28 LAB — PREPARE RBC (CROSSMATCH)

## 2022-02-28 LAB — CBG MONITORING, ED: Glucose-Capillary: 102 mg/dL — ABNORMAL HIGH (ref 70–99)

## 2022-02-28 MED ORDER — ONDANSETRON HCL 4 MG/2ML IJ SOLN: 4.0000 mg | Freq: Four times a day (QID) | INTRAMUSCULAR | Status: DC | PRN

## 2022-02-28 MED ORDER — PANTOPRAZOLE 80MG IVPB - SIMPLE MED
80.0000 mg | Freq: Once | INTRAVENOUS | Status: AC
  Administered 2022-02-28: 80 mg via INTRAVENOUS
  Filled 2022-02-28: qty 80

## 2022-02-28 MED ORDER — DEXTROSE IN LACTATED RINGERS 5 % IV SOLN: INTRAVENOUS | Status: DC

## 2022-02-28 MED ORDER — ACETAMINOPHEN 325 MG PO TABS: 650.0000 mg | ORAL_TABLET | Freq: Four times a day (QID) | ORAL | Status: DC | PRN

## 2022-02-28 MED ORDER — PANTOPRAZOLE SODIUM 40 MG IV SOLR
40.0000 mg | Freq: Two times a day (BID) | INTRAVENOUS | Status: DC
  Administered 2022-03-02 – 2022-03-04 (×6): 40 mg via INTRAVENOUS
  Filled 2022-02-28 (×6): qty 10

## 2022-02-28 MED ORDER — SODIUM CHLORIDE 0.9 % IV SOLN: 10.0000 mL/h | Freq: Once | INTRAVENOUS | Status: DC

## 2022-02-28 MED ORDER — SODIUM CHLORIDE 0.9 % IV BOLUS
500.0000 mL | Freq: Once | INTRAVENOUS | Status: AC
Start: 2022-02-28 — End: 2022-02-28
  Administered 2022-02-28: 500 mL via INTRAVENOUS

## 2022-02-28 MED ORDER — ACETAMINOPHEN 650 MG RE SUPP: 650.0000 mg | Freq: Four times a day (QID) | RECTAL | Status: DC | PRN

## 2022-02-28 MED ORDER — BISACODYL 10 MG RE SUPP: 10.0000 mg | Freq: Every day | RECTAL | Status: DC | PRN

## 2022-02-28 MED ORDER — ONDANSETRON HCL 4 MG PO TABS: 4.0000 mg | ORAL_TABLET | Freq: Four times a day (QID) | ORAL | Status: DC | PRN

## 2022-02-28 MED ORDER — SENNOSIDES-DOCUSATE SODIUM 8.6-50 MG PO TABS: 1.0000 | ORAL_TABLET | Freq: Every evening | ORAL | Status: DC | PRN

## 2022-02-28 NOTE — ED Notes (Signed)
Unable to draw back blood for type and screen from IV

## 2022-02-28 NOTE — ED Provider Notes (Signed)
[facility] DEPT Provider Note   CSN: [id number] Arrival date & time: 02/28/22  1200     History  Chief Complaint  Patient presents with   Loss of Consciousness    Sandra Jennings is a 81 y.o. female.   Loss of Consciousness  Hx of stroke, diabetes, HTN, Alzheimer's  Patient is a 81 year old female presented emergency room today with son and daughter-in-law.  It seems that she has severe dementia and has been living at home with her son for the past few years.  Patient no longer ambulates but can be helped to walk with 2 person assist. She is nonverbal at times at baseline has severe dementia and is mostly only oriented to self.  Seems that she has "good days "where she will speak.  Seems that yesterday while being watched by daughter-in-law patient slid out of a daughter's arms and was lowered slowly to the ground.  Seems that she was somewhat unresponsive for a brief period of time and did have some bluish tinged lips and then had complete resolution of the symptoms.    Another episode occurred today.      Home Medications Prior to Admission medications   Medication Sig Start Date End Date Taking? Authorizing Provider  acetaminophen (TYLENOL) 325 MG tablet Take 650 mg by mouth 3 (three) times daily as needed.   Yes [provider]  cloNIDine (CATAPRES - DOSED IN MG/24 HR) 0.1 mg/24hr patch Place 0.1 mg onto the skin once a week.   Yes [provider]  Iron-Vitamin C (VITRON-C) 65-125 MG TABS Take 1 tablet by mouth in the morning and at bedtime.   Yes [provider]  mirtazapine (REMERON) 7.5 MG tablet Take 7.5 mg by mouth at bedtime.   Yes [provider]  traZODone (DESYREL) 100 MG tablet Take 200 mg by mouth at bedtime.   Yes [provider]  vitamin E 180 MG (400 UNITS) capsule Take 400 Units by mouth daily.   Yes [provider]      Allergies    Ambien [zolpidem]    Review of  Systems   Review of Systems  Cardiovascular:  Positive for syncope.    Physical Exam Updated Vital Signs BP 132/83   Pulse 66   Temp 97.7 F (36.5 C) (Oral)   Resp 13   Wt 49 kg   SpO2 100%   BMI 17.97 kg/m  Physical Exam Vitals and nursing note reviewed.  Constitutional:      General: She is not in acute distress. HENT:     Head: Normocephalic and atraumatic.     Nose: Nose normal.     Mouth/Throat:     Comments: Pt will not open mouth  Eyes:     General: No scleral icterus.    Comments: Pale conjuntiva  Cardiovascular:     Rate and Rhythm: Normal rate and regular rhythm.     Pulses: Normal pulses.     Heart sounds: Normal heart sounds.  Pulmonary:     Effort: Pulmonary effort is normal. No respiratory distress.     Breath sounds: Normal breath sounds. No wheezing.  Abdominal:     Palpations: Abdomen is soft.     Tenderness: There is no abdominal tenderness. There is no guarding or rebound.  Musculoskeletal:     Cervical back: Normal range of motion.     Right lower leg: No edema.     Left lower leg: No edema.  Skin:  General: Skin is warm and dry.     Capillary Refill: Capillary refill takes less than 2 seconds.  Neurological:     Mental Status: She is alert. Mental status is at baseline.     Comments: Not following commands. Non-verbal.  Somewhat contracted upper extremities.   Psychiatric:        Mood and Affect: Mood normal.        Behavior: Behavior normal.     ED Results / Procedures / Treatments   Labs (all labs ordered are listed, but only abnormal results are displayed) Labs Reviewed  COMPREHENSIVE METABOLIC PANEL - Abnormal; Notable for the following components:      Result Value   Chloride 114 (*)    Glucose, Bld 112 (*)    BUN 36 (*)    Creatinine, Ser 1.22 (*)    Calcium 8.8 (*)    Total Protein 6.2 (*)    Albumin 3.0 (*)    AST 48 (*)    GFR, Estimated 45 (*)    All other components within normal limits  CBC - Abnormal; Notable  for the following components:   RBC 2.35 (*)    Hemoglobin 5.7 (*)    HCT 19.4 (*)    MCH 24.3 (*)    MCHC 29.4 (*)    RDW 17.3 (*)    All other components within normal limits  CBG MONITORING, ED - Abnormal; Notable for the following components:   Glucose-Capillary 102 (*)    All other components within normal limits  POC OCCULT BLOOD, ED - Abnormal; Notable for the following components:   Fecal Occult Bld POSITIVE (*)    All other components within normal limits  PREPARE RBC (CROSSMATCH)    EKG EKG Interpretation  Date/Time:  Sunday February 28 2022 13:24:32 EST Ventricular Rate:  67 PR Interval:  151 QRS Duration: 112 QT Interval:  428 QTC Calculation: 452 R Axis:   51 Text Interpretation: Sinus rhythm Consider left atrial enlargement RSR' in V1 or V2, probably normal variant Left ventricular hypertrophy Abnormal T, consider ischemia, lateral leads Artifact in lead(s) I II III aVR aVL aVF V1 V2 V5 V6 No significant change was found Confirmed by Glynn Octave [phone]) on 02/28/2022 1:40:11 PM  Radiology CT HEAD WO CONTRAST ( )  Result Date: 02/28/2022 CLINICAL DATA:  81 year old female with altered mental status. EXAM: CT HEAD WITHOUT CONTRAST TECHNIQUE: Contiguous axial images were obtained from the base of the skull through the vertex without intravenous contrast. RADIATION DOSE REDUCTION: This exam was performed according to the departmental dose-optimization program which includes automated exposure control, adjustment of the mA and/or kV according to patient size and/or use of iterative reconstruction technique. COMPARISON:  02/22/2013 MR FINDINGS: Brain: No evidence of acute infarction, hemorrhage, extra-axial collection or mass lesion/mass effect. Mild ventriculomegaly has increased since 2014 and may be related to central atrophy/chronic white matter changes. Hydrocephalus is difficult to entirely exclude. Atrophy and severe chronic small-vessel white matter ischemic  changes again noted. Vascular: Carotid and vertebral atherosclerotic calcifications are noted. Skull: Normal. Negative for fracture or focal lesion. Sinuses/Orbits: No acute finding. Other: None IMPRESSION: 1. Mild ventriculomegaly which is more likely related to central atrophy/chronic white matter changes, but mild communicating hydrocephalus this difficult to entirely exclude. 2. No other acute abnormalities noted. 3. Atrophy and severe chronic small-vessel white matter ischemic changes. Electronically Signed   By: Harmon Pier M.D.   On: 02/28/2022 14:18   DG Chest Portable 1 View  Result Date:  02/28/2022 CLINICAL DATA:  Altered mental status. EXAM: PORTABLE CHEST 1 VIEW COMPARISON:  03/03/2020 chest radiograph FINDINGS: The cardiomediastinal silhouette is not significantly changed. There is no evidence of focal airspace disease, pulmonary edema, suspicious pulmonary nodule/mass, pleural effusion, or pneumothorax. No acute bony abnormalities are identified. Density overlying the LEFT chest/breast again noted, question prosthesis. IMPRESSION: No active disease. Electronically Signed   By: Harmon Pier M.D.   On: 02/28/2022 14:13    Procedures .Critical Care  Performed by: Gailen Shelter, PA Authorized by: Gailen Shelter, PA   Critical care provider statement:    Critical care time (minutes):  35   Critical care time was exclusive of:  Separately billable procedures and treating other patients and teaching time   Critical care was necessary to treat or prevent imminent or life-threatening deterioration of the following conditions: Severe symptomatic anemia.   Critical care was time spent personally by me on the following activities:  Development of treatment plan with patient or surrogate, review of old charts, re-evaluation of patient's condition, pulse oximetry, ordering and review of radiographic studies, ordering and review of laboratory studies, ordering and performing treatments and  interventions, obtaining history from patient or surrogate, examination of patient and evaluation of patient's response to treatment   Care discussed with: admitting provider       Medications Ordered in ED Medications  0.9 %  sodium chloride infusion (has no administration in time range)  pantoprazole (PROTONIX) 80 mg /NS 100 mL IVPB (has no administration in time range)  pantoprazole (PROTONIX) injection 40 mg (has no administration in time range)  dextrose 5 % in lactated ringers infusion (has no administration in time range)  sodium chloride 0.9 % bolus 500 mL (0 mLs Intravenous Stopped 02/28/22 1451)    ED Course/ Medical Decision Making/ A&P                           Medical Decision Making Amount and/or Complexity of Data Reviewed Labs: ordered. Radiology: ordered. ECG/medicine tests: ordered.  Risk Prescription drug management. Decision regarding hospitalization.   This patient presents to the ED for concern of fatigue/episodes of AMS, this involves a number of treatment options, and is a complaint that carries with it a high risk of complications and morbidity. A differential diagnosis was considered for the patient's symptoms which is discussed below:   The differential diagnosis of weakness includes but is not limited to neurologic causes (GBS, myasthenia gravis, CVA, MS, ALS, transverse myelitis, spinal cord injury, CVA, botulism, ) and other causes: ACS, Arrhythmia, syncope, orthostatic hypotension, sepsis, hypoglycemia, electrolyte disturbance, hypothyroidism, respiratory failure, symptomatic anemia, dehydration, heat injury, polypharmacy, malignancy.    Co morbidities: Discussed in HPI   Brief History:  Hx of stroke, diabetes, HTN, Alzheimer's  Patient is a 81 year old female presented emergency room today with son and daughter-in-law.  It seems that she has severe dementia and has been living at home with her son for the past few years.  Patient no longer  ambulates but can be helped to walk with 2 person assist. She is nonverbal at times at baseline has severe dementia and is mostly only oriented to self.  Seems that she has "good days "where she will speak.  Seems that yesterday while being watched by daughter-in-law patient slid out of a daughter's arms and was lowered slowly to the ground.  Seems that she was somewhat unresponsive for a brief period of time and did have  some bluish tinged lips and then had complete resolution of the symptoms.    Another episode occurred today.     EMR reviewed including pt PMHx, past surgical history and past visits to ER.   See HPI for more details   Lab Tests:   I ordered and independently interpreted labs. Labs notable for severe anemia 5.7 patient has limited prior labs however it seems her baseline is between 8 and 9 hemoglobin.  Fecal occult positive with black stool.  Patient is on iron supplements per her son uncertain how long stool has been black.  CMP with elevated BUN and creatinine at baseline.   Imaging Studies:  NAD. I personally reviewed all imaging studies and no acute abnormality found. I agree with radiology interpretation.  No acute findings on CT head or chest x-ray  Cardiac Monitoring:  The patient was maintained on a cardiac monitor.  I personally viewed and interpreted the cardiac monitored which showed an underlying rhythm of: NSR EKG non-ischemic   Medicines ordered:  I ordered medication including PRBC 2 units, Protonix for symptomatic anemia, GI bleed Reevaluation of the patient after these medicines showed that the patient stayed the same I have reviewed the patients home medicines and have made adjustments as needed   Critical Interventions:   blood transfusion   Consults/Attending Physician   I discussed this case with my attending physician who cosigned this note including patient's presenting symptoms, physical exam, and planned diagnostics and  interventions. Attending physician stated agreement with plan or made changes to plan which were implemented.   Reevaluation:  After the interventions noted above I re-evaluated patient and found that they have :stayed the same   Social Determinants of Health:      Problem List / ED Course:  Symptomatic anemia requiring blood transfusion.  Will require admission for transfusion and trending of hemoglobins.  Dr. Marca Ancona aware of pt. Admitted to hospitalist.    Dispostion:  After consideration of the diagnostic results and the patients response to treatment, I feel that the patent would benefit from admission.   Final Clinical Impression(s) / ED Diagnoses Final diagnoses:  Symptomatic anemia    Rx / DC Orders ED Discharge Orders     None         Gailen Shelter, [place] 02/28/22 1541    Glynn Octave, MD 03/01/22 1501

## 2022-02-28 NOTE — ED Notes (Signed)
ED Provider at bedside. 

## 2022-02-28 NOTE — H&P (Signed)
History and Physical  Patient: Sandra Jennings EHM:[id number] DOB: 1940/10/06 DOA: 02/28/2022 DOS: the patient was seen and examined on 02/28/2022 Patient coming from: Home  Chief Complaint:  Chief Complaint  Patient presents with   Loss of Consciousness   HPI: Sandra Jennings is a 81 y.o. female with PMH significant of dementia, HTN, CVA with hypertension presented to the hospital with complaints of syncopal event. Patient is a poor historian and at her baseline is nonverbal mostly therefore history was taken from daughter in law and son. Patient has had black-colored bowel movement ongoing for last 3 to 6 months which are mostly loose. BMI reported on more than 1-2 during the day. Patient takes iron supplement along with that. Patient has not reported any abdominal pain. Yesterday while trying to move her and clean her patient had a syncopal event and she passed out but she came around on her own. Today again while family was trying to clean her up patient had another episode of passing out event. EMS was called. No recent change in medications.  No diarrhea reported potential patient does not take any ibuprofen Aleve naproxen or any other blood thinners at her baseline. Per family patient's oral intake has been adequate. Patient ambulates with walker or assistance and mostly mobile with wheelchair.  Review of Systems: As mentioned in the history of present illness. All other systems reviewed and are negative. Past Medical History:  Diagnosis Date   Dementia ([place])    Hypertension    Stroke [facility])    Past Surgical History:  Procedure Laterality Date   MASTECTOMY Bilateral    Social History:  reports that she has never smoked. She has never used smokeless tobacco. She reports that she does not drink alcohol and does not use drugs. Allergies  Allergen Reactions   Ambien [Zolpidem] Other (See Comments)    hallucinations   Family History  Problem Relation Age of Onset   Hypertension  Son    Stomach cancer Neg Hx    Colon cancer Neg Hx    Pancreatic cancer Neg Hx    Prior to Admission medications   Medication Sig Start Date End Date Taking? Authorizing Provider  acetaminophen (TYLENOL) 325 MG tablet Take 650 mg by mouth 3 (three) times daily as needed.   Yes [provider]  cloNIDine (CATAPRES - DOSED IN MG/24 HR) 0.1 mg/24hr patch Place 0.1 mg onto the skin once a week.   Yes [provider]  Iron-Vitamin C (VITRON-C) 65-125 MG TABS Take 1 tablet by mouth in the morning and at bedtime.   Yes [provider]  mirtazapine (REMERON) 7.5 MG tablet Take 7.5 mg by mouth at bedtime.   Yes [provider]  traZODone (DESYREL) 100 MG tablet Take 200 mg by mouth at bedtime.   Yes [provider]  vitamin E 180 MG (400 UNITS) capsule Take 400 Units by mouth daily.   Yes [provider]   Physical Exam: Vitals:   02/28/22 1516 02/28/22 1600 02/28/22 1700 02/28/22 1821  BP: 132/83  135/74 (!) 133/94  Pulse: 66  68 (!) 109  Resp: 13  20 18   Temp:  97.7 F (36.5 C)  (!) 97.5 F (36.4 C)  TempSrc:    Axillary  SpO2: 100%  99% 99%  Weight:       General: Appear in mild distress; no visible Abnormal Neck Mass Or lumps, Conjunctiva normal Cardiovascular: S1 and S2 Present, no Murmur, Respiratory: good respiratory effort, Bilateral Air entry  present and CTA, no Crackles, no wheezes Abdomen: Bowel Sound present, Non tender  Extremities: no Pedal edema Neurology: alert and non verbal Gait not checked due to patient safety concerns   Data Reviewed: Since last encounter, pertinent lab results CBC and BMP   . I have ordered test including CBC and BMP  . I have discussed pt's care plan and test results with EDP  .   Assessment and Plan Melena. Upper GI bleed. Patient presents with complaints of lack of a bowel movement but Does not appear to have any significant change in her BM pattern for the last few days. Hemoccult is  positive but GI consulted. Currently on IV PPI twice daily. Remains n.p.o. for now.  Symptomatic anemia. Syncope. Hemoglobin on admission is 5.7. Patient had 2 episodes of syncope. Currently receiving 2 PRBC transfusion. Hemoglobin for transfusion threshold is 7 for her.  History of iron deficiency. Patient is on oral iron. We will recheck the iron level tomorrow morning and initiate IV iron therapy if indicated.  Dementia. History of CVA. Nonverbal at baseline per PT OT consulted. CT head unremarkable. No further work-up for now.  patient is on a mechanical soft diet at home but which we will continue the same when the patient is able to eat adequately.  Mood disorder  patient is on trazodone and Remeron.  Currently on hold.  Essential hypertension. Patient is on clonidine currently on hold.  Syncope. At present I do not think the patient requires more work-up as symptomatically as most likely explanation. Monitor on telemetry in progressive care unit.  Advance Care Planning: Full code per my discussion with the family Consults: [facility] Family Communication: Family at bedside  Author: Lynden Oxford, MD 02/28/2022 7:04 PM For on call review [email or web address].

## 2022-02-28 NOTE — ED Notes (Signed)
Patient transported to CT 

## 2022-02-28 NOTE — ED Notes (Signed)
[place] PA aware critical hemoglobin 5.7.

## 2022-02-28 NOTE — ED Triage Notes (Signed)
Per EMS, patient from home, family reports patient was sitting in chair, "fell asleep, snored for five seconds with dry and pale lips, and then woke up." Family reports the same happened yesterday. Hx dementia. Baseline per family.  BP 124/53 HR 70 93% RA CBG 156

## 2022-02-28 NOTE — Consult Note (Signed)
WOC Nurse Consult Note: Reason for Consult:Stage 2 pressure injury to sacrum and right hip.  Wound type:Pressure Pressure Injury POA: Yes Measurement:Bedside RN to measure and document measurements on Nursing Flow Sheet with application of next dressing change today Wound CBS:WHQP, moist Drainage (amount, consistency, odor) scant serous Periwound:intact, pale, dry Dressing procedure/placement/frequency:I have provided Nursing with guidance for the topical care of these two lesions using a NS cleanse followed by topping the open areas with xeroform gauze, dry gauze 2x2 and covering with a silicone foam. Turning and repositioning is in place and guidance is provided for minimizing time in the supine position. Bilateral Prevalon boots are provided.  [place] nursing team will not follow, but will remain available to this patient, the nursing and medical teams.  Please re-consult if needed.  Thank you for inviting [place] to participate in this patient's Plan of Care.  Maudie Flakes, MSN, RN, CNS, [place], Serita Grammes, [employer], [employer] phone:  [phone]

## 2022-03-01 ENCOUNTER — Inpatient Hospital Stay (HOSPITAL_COMMUNITY): Payer: Medicare (Managed Care)

## 2022-03-01 DIAGNOSIS — R0902 Hypoxemia: Secondary | ICD-10-CM | POA: Diagnosis not present

## 2022-03-01 DIAGNOSIS — I10 Essential (primary) hypertension: Secondary | ICD-10-CM | POA: Diagnosis present

## 2022-03-01 DIAGNOSIS — R55 Syncope and collapse: Secondary | ICD-10-CM | POA: Diagnosis present

## 2022-03-01 DIAGNOSIS — R64 Cachexia: Secondary | ICD-10-CM | POA: Diagnosis present

## 2022-03-01 DIAGNOSIS — Z8249 Family history of ischemic heart disease and other diseases of the circulatory system: Secondary | ICD-10-CM | POA: Diagnosis not present

## 2022-03-01 DIAGNOSIS — Z66 Do not resuscitate: Secondary | ICD-10-CM | POA: Diagnosis not present

## 2022-03-01 DIAGNOSIS — R627 Adult failure to thrive: Secondary | ICD-10-CM | POA: Diagnosis present

## 2022-03-01 DIAGNOSIS — R636 Underweight: Secondary | ICD-10-CM | POA: Diagnosis present

## 2022-03-01 DIAGNOSIS — Z9882 Breast implant status: Secondary | ICD-10-CM | POA: Diagnosis not present

## 2022-03-01 DIAGNOSIS — K922 Gastrointestinal hemorrhage, unspecified: Secondary | ICD-10-CM | POA: Diagnosis present

## 2022-03-01 DIAGNOSIS — G309 Alzheimer's disease, unspecified: Secondary | ICD-10-CM | POA: Diagnosis present

## 2022-03-01 DIAGNOSIS — D649 Anemia, unspecified: Principal | ICD-10-CM | POA: Diagnosis present

## 2022-03-01 DIAGNOSIS — F02C3 Dementia in other diseases classified elsewhere, severe, with mood disturbance: Secondary | ICD-10-CM | POA: Diagnosis present

## 2022-03-01 DIAGNOSIS — Z8673 Personal history of transient ischemic attack (TIA), and cerebral infarction without residual deficits: Secondary | ICD-10-CM | POA: Diagnosis not present

## 2022-03-01 DIAGNOSIS — N6320 Unspecified lump in the left breast, unspecified quadrant: Secondary | ICD-10-CM | POA: Diagnosis present

## 2022-03-01 DIAGNOSIS — Z79899 Other long term (current) drug therapy: Secondary | ICD-10-CM | POA: Diagnosis not present

## 2022-03-01 DIAGNOSIS — Z681 Body mass index (BMI) 19 or less, adult: Secondary | ICD-10-CM | POA: Diagnosis not present

## 2022-03-01 DIAGNOSIS — Z515 Encounter for palliative care: Secondary | ICD-10-CM | POA: Diagnosis not present

## 2022-03-01 DIAGNOSIS — Z8639 Personal history of other endocrine, nutritional and metabolic disease: Secondary | ICD-10-CM | POA: Diagnosis not present

## 2022-03-01 DIAGNOSIS — L89212 Pressure ulcer of right hip, stage 2: Secondary | ICD-10-CM | POA: Diagnosis present

## 2022-03-01 DIAGNOSIS — K921 Melena: Secondary | ICD-10-CM | POA: Diagnosis present

## 2022-03-01 DIAGNOSIS — Z888 Allergy status to other drugs, medicaments and biological substances status: Secondary | ICD-10-CM | POA: Diagnosis not present

## 2022-03-01 DIAGNOSIS — L89152 Pressure ulcer of sacral region, stage 2: Secondary | ICD-10-CM | POA: Diagnosis present

## 2022-03-01 DIAGNOSIS — E119 Type 2 diabetes mellitus without complications: Secondary | ICD-10-CM | POA: Diagnosis present

## 2022-03-01 DIAGNOSIS — D500 Iron deficiency anemia secondary to blood loss (chronic): Secondary | ICD-10-CM | POA: Diagnosis present

## 2022-03-01 DIAGNOSIS — R160 Hepatomegaly, not elsewhere classified: Secondary | ICD-10-CM | POA: Diagnosis present

## 2022-03-01 DIAGNOSIS — Z9013 Acquired absence of bilateral breasts and nipples: Secondary | ICD-10-CM | POA: Diagnosis not present

## 2022-03-01 LAB — COMPREHENSIVE METABOLIC PANEL
ALT: 26 U/L (ref 0–44)
AST: 64 U/L — ABNORMAL HIGH (ref 15–41)
Albumin: 2.8 g/dL — ABNORMAL LOW (ref 3.5–5.0)
Alkaline Phosphatase: 85 U/L (ref 38–126)
Anion gap: 6 (ref 5–15)
BUN: 35 mg/dL — ABNORMAL HIGH (ref 8–23)
CO2: 20 mmol/L — ABNORMAL LOW (ref 22–32)
Calcium: 8.6 mg/dL — ABNORMAL LOW (ref 8.9–10.3)
Chloride: 116 mmol/L — ABNORMAL HIGH (ref 98–111)
Creatinine, Ser: 1.16 mg/dL — ABNORMAL HIGH (ref 0.44–1.00)
GFR, Estimated: 48 mL/min — ABNORMAL LOW (ref >60)
Glucose, Bld: 85 mg/dL (ref 70–99)
Potassium: 4.2 mmol/L (ref 3.5–5.1)
Sodium: 142 mmol/L (ref 135–145)
Total Bilirubin: 0.6 mg/dL (ref 0.3–1.2)
Total Protein: 5.9 g/dL — ABNORMAL LOW (ref 6.5–8.1)

## 2022-03-01 LAB — GLUCOSE, CAPILLARY
Glucose-Capillary: 118 mg/dL — ABNORMAL HIGH (ref 70–99)
Glucose-Capillary: 61 mg/dL — ABNORMAL LOW (ref 70–99)
Glucose-Capillary: 71 mg/dL (ref 70–99)
Glucose-Capillary: 75 mg/dL (ref 70–99)
Glucose-Capillary: 83 mg/dL (ref 70–99)

## 2022-03-01 LAB — CBC
HCT: 16.1 % — ABNORMAL LOW (ref 36.0–46.0)
HCT: 16.3 % — ABNORMAL LOW (ref 36.0–46.0)
Hemoglobin: 5 g/dL — CL (ref 12.0–15.0)
Hemoglobin: 5.1 g/dL — CL (ref 12.0–15.0)
MCH: 24.5 pg — ABNORMAL LOW (ref 26.0–34.0)
MCH: 25.1 pg — ABNORMAL LOW (ref 26.0–34.0)
MCHC: 31.1 g/dL (ref 30.0–36.0)
MCHC: 31.3 g/dL (ref 30.0–36.0)
MCV: 78.9 fL — ABNORMAL LOW (ref 80.0–100.0)
MCV: 80.3 fL (ref 80.0–100.0)
Platelets: 213 10*3/uL (ref 150–400)
Platelets: 190 10*3/uL (ref 150–400)
RBC: 2.04 MIL/uL — ABNORMAL LOW (ref 3.87–5.11)
RBC: 2.03 MIL/uL — ABNORMAL LOW (ref 3.87–5.11)
RDW: 16.9 % — ABNORMAL HIGH (ref 11.5–15.5)
RDW: 17.3 % — ABNORMAL HIGH (ref 11.5–15.5)
WBC: 5.2 10*3/uL (ref 4.0–10.5)
WBC: 5.1 10*3/uL (ref 4.0–10.5)
nRBC: 0 % (ref 0.0–0.2)
nRBC: 0 % (ref 0.0–0.2)

## 2022-03-01 LAB — DIRECT ANTIGLOBULIN TEST (NOT AT ARMC)
DAT, IgG: NEGATIVE
DAT, complement: NEGATIVE

## 2022-03-01 LAB — PROTIME-INR
INR: 1.3 — ABNORMAL HIGH (ref 0.8–1.2)
INR: 1.3 — ABNORMAL HIGH (ref 0.8–1.2)
Prothrombin Time: 16.1 seconds — ABNORMAL HIGH (ref 11.4–15.2)
Prothrombin Time: 15.9 seconds — ABNORMAL HIGH (ref 11.4–15.2)

## 2022-03-01 LAB — PREPARE RBC (CROSSMATCH)

## 2022-03-01 LAB — HEMOGLOBIN AND HEMATOCRIT, BLOOD
HCT: 21.3 % — ABNORMAL LOW (ref 36.0–46.0)
Hemoglobin: 6.6 g/dL — CL (ref 12.0–15.0)

## 2022-03-01 LAB — FIBRINOGEN: Fibrinogen: 332 mg/dL (ref 210–475)

## 2022-03-01 MED ORDER — DEXTROSE 50 % IV SOLN
12.5000 g | Freq: Once | INTRAVENOUS | Status: AC
  Administered 2022-03-01: 12.5 g via INTRAVENOUS
  Filled 2022-03-01: qty 50

## 2022-03-01 MED ORDER — IOHEXOL 350 MG/ML SOLN
100.0000 mL | Freq: Once | INTRAVENOUS | Status: AC | PRN
  Administered 2022-03-01: 100 mL via INTRAVENOUS

## 2022-03-01 MED ORDER — SODIUM CHLORIDE 0.9 % IV SOLN: INTRAVENOUS | Status: DC

## 2022-03-01 MED ORDER — SODIUM CHLORIDE 0.9% IV SOLUTION: Freq: Once | INTRAVENOUS | Status: DC

## 2022-03-01 MED ORDER — DEXTROSE 50 % IV SOLN
INTRAVENOUS | Status: AC
  Filled 2022-03-01: qty 50

## 2022-03-01 NOTE — Hospital Course (Addendum)
Sandra Jennings is a 81 y.o. female with PMH significant of dementia, HTN, CVA with hypertension presented to the hospital with complaints of syncopal event.  Found to have symptomatic anemia with hemoglobin of 5.7.  After 3 PRBC transfusion hemoglobin remains at 5.  No active bleeding seen so far.  CT abdomen negative for any active bleeding.  Initiate hemolytic work-up.  GI consulted for complaints of melena.  Patient's hemoglobin did not respond to multiple transfusion well. 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 On further discussion with family with regards to goals of care decision was made to transition to complete comfort with DNR/DNI with goal to go home with hospice.

## 2022-03-01 NOTE — Consult Note (Signed)
Referring Provider: Dr. Berle Mull  Primary Care Physician:  [facility] Gastroenterologist:  Dr. Owens Loffler   Reason for Consultation: Anemia, positive FOBT  HPI: Sandra Jennings is a 81 y.o. female with a past medical history of dementia, CVA, hypertension and diabetes mellitus type 2.  He has progressive dementia and lives with her son and daughter-in-law.  Family is present at this time.  Patient is nonverbal.  She was at home with her daughter-in-law 02/27/2022 who witnessed a syncopal episode.  Her mother-in-law.  She had a second episode of syncope 02/28/2022 for she was sent to the ED for further evaluation.  Labs in the ED showed a hemoglobin level of 5.7 (Hg 8.9 1 month ago).  Hematocrit 19.4.  MCV 82.6.  RDW 17.3.  WBC 4.8.  Platelet 220.  BUN 36.  Creatinine 1.22 (Cr 1.75 on 01/03/2022).  Total bili 0.5.  Alk phos 86.  AST 48.  ALT 24.  FOBT positive.  She was transfused 2 units of PRBCs -> Hg 5.0 -> 3rd unit of PRBCs infiltrated this morning posttransfusion presenting complaint.  IV therapy is at the bedside attempting to place a new IV.  A GI consult was requested for further evaluation regarding anemia with GI bleed.  No BM since admission and has reported by her RN.  I called her son, Sandra Jennings, he verified his mother is nonverbal.  He takes oral iron at home for the past year.  Her stools have been black color since she started taking oral iron.  She passes 1-2 soft to loose black stools daily.  No bright red rectal bleeding.  He is not on blood thinners at home.  No NSAID use.  Her oral intake varies.  At times, she eats a soft diet and other times she holds food in her mouth without swallowing.  She consumes Ensure when her solid food intake is low.  She possibly underwent 1 colonoscopy more than 10 years ago when she lived in [place] in [place].  No known history of GI bleed.   She was seen in our outpatient GI clinic by Dr. Ardis Hughs  04/29/2020 due to having 1 choking event 02/2020 without prior history of dysphagia.  Conservative management was recommended and endoscopic evaluation was deferred.  Patient was to return to the GI clinic if she instructed any further difficulty swallowing.  She has not been seen in our office since that time.   Past Surgical History:  Procedure Laterality Date   MASTECTOMY Bilateral     Prior to Admission medications   Medication Sig Start Date End Date Taking? Authorizing Provider  acetaminophen (TYLENOL) 325 MG tablet Take 650 mg by mouth 3 (three) times daily as needed.   Yes [provider]  cloNIDine (CATAPRES - DOSED IN MG/24 HR) 0.1 mg/24hr patch Place 0.1 mg onto the skin once a week.   Yes [provider]  Iron-Vitamin C (VITRON-C) 65-125 MG TABS Take 1 tablet by mouth in the morning and at bedtime.   Yes [provider]  mirtazapine (REMERON) 7.5 MG tablet Take 7.5 mg by mouth at bedtime.   Yes [provider]  traZODone (DESYREL) 100 MG tablet Take 200 mg by mouth at bedtime.   Yes [provider]  vitamin E 180 MG (400 UNITS) capsule Take 400 Units by mouth daily.   Yes [provider]    Current Facility-Administered Medications  Medication Dose Route Frequency Provider Last  Rate Last Admin   0.9 %  sodium chloride infusion (Manually program via Guardrails IV Fluids)   Intravenous Once Kathryne Eriksson, NP       0.9 %  sodium chloride infusion  10 mL/hr Intravenous Once Fondaw, Wylder S, PA       acetaminophen (TYLENOL) tablet 650 mg  650 mg Oral Q6H PRN Lavina Hamman, MD       Or   acetaminophen (TYLENOL) suppository 650 mg  650 mg Rectal Q6H PRN Lavina Hamman, MD       bisacodyl (DULCOLAX) suppository 10 mg  10 mg Rectal Daily PRN Lavina Hamman, MD       dextrose 5 % in lactated ringers infusion   Intravenous Continuous Kathryne Eriksson, NP 75 mL/hr at 03/01/22 0017 Rate Change at 03/01/22 0017   ondansetron  (ZOFRAN) tablet 4 mg  4 mg Oral Q6H PRN Lavina Hamman, MD       Or   ondansetron [facility]) injection 4 mg  4 mg Intravenous Q6H PRN Lavina Hamman, MD       pantoprazole (PROTONIX) injection 40 mg  40 mg Intravenous Q12H Lavina Hamman, MD       senna-docusate (Senokot-S) tablet 1 tablet  1 tablet Oral QHS PRN Lavina Hamman, MD        Allergies as of 02/28/2022 - Review Complete 02/28/2022  Allergen Reaction Noted   Ambien [zolpidem] Other (See Comments) 04/28/2020    Family History  Problem Relation Age of Onset   Hypertension Son    Stomach cancer Neg Hx    Colon cancer Neg Hx    Pancreatic cancer Neg Hx     Social History   Socioeconomic History   Marital status: Widowed    Spouse name: Not on file   Number of children: Not on file   Years of education: Not on file   Highest education level: Not on file  Occupational History   Not on file  Tobacco Use   Smoking status: Never   Smokeless tobacco: Never  Substance and Sexual Activity   Alcohol use: Never   Drug use: Never   Sexual activity: Not on file  Other Topics Concern   Not on file  Social History Narrative   Not on file   Social Determinants of Health   Financial Resource Strain: Not on file  Food Insecurity: No Food Insecurity (02/28/2022)   Hunger Vital Sign    Worried About Running Out of Food in the Last Year: Never true    Ran Out of Food in the Last Year: Never true  Transportation Needs: No Transportation Needs (02/28/2022)   PRAPARE - Hydrologist (Medical): No    Lack of Transportation (Non-Medical): No  Physical Activity: Not on file  Stress: Not on file  Social Connections: Not on file  Intimate Partner Violence: Unknown (02/28/2022)   Humiliation, Afraid, Rape, and Kick questionnaire    Fear of Current or Ex-Partner: Patient refused    Emotionally Abused: Patient refused    Physically Abused: Patient refused    Sexually Abused: Patient refused    Review  of Systems: See HPI.  Unable to obtain review of systems as the patient is nonverbal.  Physical Exam: Vital signs in last 24 hours: Temp:  [97.5 F (36.4 C)-99.2 F (37.3 C)] 99 F (37.2 C) (11/06 0642) Pulse Rate:  [66-109] 74 (11/06 0642) Resp:  [10-24] 24 (11/06 0642) BP: (  132-156)/(67-94) 148/74 (11/06 1610) SpO2:  [90 %-100 %] 93 % (11/06 0627) Weight:  [49 kg] 49 kg (11/05 1332) Last BM Date : 02/28/22  General: 81 year old female awake, nonverbal in no acute distress. Head:  Normocephalic and atraumatic. Eyes:  No scleral icterus. Conjunctiva pink. Ears:  Normal auditory acuity. Nose:  No deformity, discharge or lesions. Mouth: Patient will not open mouth for exam. Neck:  Supple. No lymphadenopathy or thyromegaly.  Lungs: Sounds clear, decreased in bases. Heart: Regular, no murmurs. Chest: ? Left breast implant with anasarca. Abdomen: Soft, nondistended.  Hypoactive bowel sounds to all 4 quadrants.  No palpable mass. Rectal: Deferred. Musculoskeletal: Bilateral muscle wasting. Pulses:  Normal pulses noted. Extremities: Extremities without edema. LUE with gross edema.  Neurologic: Patient is nonverbal.  Does not follow commands. Skin:  Intact without significant lesions or rashes. Psych: Patient is not agitated.  Intake/Output from previous day: 11/05 0701 - 11/06 0700 In: 1254.5 [I.V.:500.5; Blood:654; IV Piggyback:100] Out: 500 [Urine:500] Intake/Output this shift: No intake/output data recorded.  Lab Results: Recent Labs    02/28/22 1402 02/28/22 1955 03/01/22 0358  WBC 4.8 5.4 5.2  HGB 5.7* 5.4* 5.0*  HCT 19.4* 17.9* 16.1*  PLT 220 197 213   BMET Recent Labs    02/28/22 1402 03/01/22 0358  NA 143 142  K 4.4 4.2  CL 114* 116*  CO2 23 20*  GLUCOSE 112* 85  BUN 36* 35*  CREATININE 1.22* 1.16*  CALCIUM 8.8* 8.6*   LFT Recent Labs    03/01/22 0358  PROT 5.9*  ALBUMIN 2.8*  AST 64*  ALT 26  ALKPHOS 85  BILITOT 0.6   PT/INR Recent Labs     03/01/22 0358  LABPROT 16.1*  INR 1.3*   Hepatitis Panel No results for input(s): "HEPBSAG", "HCVAB", "HEPAIGM", "HEPBIGM" in the last 72 hours.    Studies/Results: CT HEAD WO CONTRAST (5MM)  Result Date: 02/28/2022 CLINICAL DATA:  81 year old female with altered mental status. EXAM: CT HEAD WITHOUT CONTRAST TECHNIQUE: Contiguous axial images were obtained from the base of the skull through the vertex without intravenous contrast. RADIATION DOSE REDUCTION: This exam was performed according to the departmental dose-optimization program which includes automated exposure control, adjustment of the mA and/or kV according to patient size and/or use of iterative reconstruction technique. COMPARISON:  02/22/2013 MR FINDINGS: Brain: No evidence of acute infarction, hemorrhage, extra-axial collection or mass lesion/mass effect. Mild ventriculomegaly has increased since 2014 and may be related to central atrophy/chronic white matter changes. Hydrocephalus is difficult to entirely exclude. Atrophy and severe chronic small-vessel white matter ischemic changes again noted. Vascular: Carotid and vertebral atherosclerotic calcifications are noted. Skull: Normal. Negative for fracture or focal lesion. Sinuses/Orbits: No acute finding. Other: None IMPRESSION: 1. Mild ventriculomegaly which is more likely related to central atrophy/chronic white matter changes, but mild communicating hydrocephalus this difficult to entirely exclude. 2. No other acute abnormalities noted. 3. Atrophy and severe chronic small-vessel white matter ischemic changes. Electronically Signed   By: Margarette Canada M.D.   On: 02/28/2022 14:18   DG Chest Portable 1 View  Result Date: 02/28/2022 CLINICAL DATA:  Altered mental status. EXAM: PORTABLE CHEST 1 VIEW COMPARISON:  03/03/2020 chest radiograph FINDINGS: The cardiomediastinal silhouette is not significantly changed. There is no evidence of focal airspace disease, pulmonary edema, suspicious  pulmonary nodule/mass, pleural effusion, or pneumothorax. No acute bony abnormalities are identified. Density overlying the LEFT chest/breast again noted, question prosthesis. IMPRESSION: No active disease. Electronically Signed   By: Dellis Filbert  Hu M.D.   On: 02/28/2022 14:13    IMPRESSION/PLAN:  65) 81 year old female with chronic IDA admitted to the hospital with profound anemia, positive FOBT.  Admission hemoglobin 5.7 -> transfused 2 units of PRBCs -> Hg 5.0.  Third unit of PRBCs infiltrated earlier this morning therefore transfusion was discontinued.  IV therapy at the bedside for new IV placement at this time.  No overt GI bleeding.  -Check H&H every 6 hours x 24 hrs -Transfuse for hemoglobin less than 7 -NPO -Pantoprazole 40 mg IV twice daily -Ondansetron 4 mg p.o. or IV every 6 hours as needed -Defer decision regarding endoscopic evaluation during this hospitalization due to Dr. Tarri Glenn.  I spoke to the patient's son, Sandra Jennings, and he provided verbal consent for any recommended endoscopic evaluation if medically necessary.    2) Progressive dementia  3) History of CVA   Noralyn Pick  03/01/2022, 10:03 AM

## 2022-03-01 NOTE — Progress Notes (Signed)
[facility] Progress Note Patient: Sandra Jennings GGE:[id number] DOB: 1941-04-21 DOA: 02/28/2022  DOS: the patient was seen and examined on 03/01/2022  Brief hospital course:  Sandra Jennings is a 81 y.o. female with PMH significant of dementia, HTN, CVA with hypertension presented to the hospital with complaints of syncopal event.  Found to have symptomatic anemia with hemoglobin of 5.7.  After 3 PRBC transfusion hemoglobin remains at 5.  No active bleeding seen so far.  CT abdomen negative for any active bleeding.  Initiate hemolytic work-up.  GI consulted for complaints of melena.  Currently NPO.  Will need EGD. Assessment and Plan: Symptomatic anemia. Melena. Upper GI bleed. Continue with PRBC transfusion. Transfuse for hemoglobin less than 7 or hemodynamic instability. CT angio negative for any active bleeding. Continue IV PPI twice daily. May require midline/PICC line placement. Remains n.p.o. for now. Now there is concern for hemolytic process as patient does not have any significant blood loss since admission and has not responded well to blood transfusion.  Syncope. Most likely secondary to anemia. For now monitor on telemetry.  Iron deficiency. Patient on oral iron.  We will check iron level and most likely will provide IV iron.  Dementia. History of CVA. Nonverbal at baseline.  Subjective: No acute complaint.  No nausea or vomiting.  Remains nonverbal.  1 BM melanotic in color but not significantly in volume.  Physical Exam: General: in mild distress;  Cardiovascular: S1 and S2 Present, no Murmur Respiratory: Normal respiratory effort, Bilateral Air entry present, no Crackles, no wheezes Abdomen: Bowel Sound present, Non tender  Extremities: no edema Neurology: alert and non verbal   Data Reviewed: I have Reviewed nursing notes, Vitals, and Lab results. Since last encounter, pertinent lab results CBC and BMP   . I have ordered test including CBC, BMP, reticulocyte  count, type and screen, smear, iron studies, fibrinogen, folic acid, haptoglobin  . I have discussed pt's care plan and test results with GI  . I have ordered imaging CT angio  .   Disposition: Status is: Inpatient Remains inpatient appropriate because: Need for further transfusion  SCDs Start: 02/28/22 1911   Family Communication: Son at bedside Level of care: Progressive maintain progressive due to anemia  Vitals:   03/01/22 0642 03/01/22 0915 03/01/22 1450 03/01/22 1504  BP: (!) 148/74 (!) 161/44 (!) 150/73   Pulse: 74 (!) 51 71 71  Resp: (!) 24 18 18    Temp: 99 F (37.2 C) 98.7 F (37.1 C) 98.4 F (36.9 C)   TempSrc:  Axillary Oral   SpO2:  93% (!) 82% (!) 88%  Weight:      Height:         Author: Berle Mull, MD 03/01/2022 6:46 PM  Please look on [email or web address] to find out who is on call.

## 2022-03-01 NOTE — Progress Notes (Signed)
Chaplain provided support to Zeyna's family through getting coffee and cup of ice for them.  Family also wanted to speak with nurse.  Chaplain alerted Unit Secretary of need.     03/01/22 1300  Clinical Encounter Type  Visited With Family  Visit Type Initial

## 2022-03-01 NOTE — Evaluation (Signed)
Physical Therapy One Time Evaluation Patient Details Name: Sandra Jennings MRN: [id number] DOB: 02/12/1941 Today's Date: 03/01/2022  History of Present Illness  81 y.o. female with a past medical history of dementia, CVA, hypertension, and diabetes mellitus type 2.  Pt has progressive dementia and lives with her son and daughter-in-law.   Patient is nonverbal.  Patient ambulates with walker or assistance and mostly mobile with wheelchair.  Clinical Impression  Patient evaluated by Physical Therapy with no further acute PT needs identified.  Pt with history of dementia and is not able to follow commands or multimodal cues.  Pt from home with family per chart and appears +2 assist for mobility at baseline.  See below for any follow-up Physical Therapy or equipment needs. PT is signing off. Thank you for this referral.        Recommendations for follow up therapy are one component of a multi-disciplinary discharge planning process, led by the attending physician.  Recommendations may be updated based on patient status, additional functional criteria and insurance authorization.  Follow Up Recommendations No PT follow up      Assistance Recommended at Discharge Frequent or constant Supervision/Assistance  Patient can return home with the following  A lot of help with bathing/dressing/bathroom;Two people to help with walking and/or transfers;Assistance with feeding;Assistance with cooking/housework;Help with stairs or ramp for entrance;Assist for transportation;Direct supervision/assist for financial management    Equipment Recommendations None recommended by PT  Recommendations for Other Services       Functional Status Assessment Patient has not had a recent decline in their functional status     Precautions / Restrictions Precautions Precautions: Fall      Mobility  Bed Mobility Overal bed mobility: Needs Assistance Bed Mobility: Supine to Sit, Sit to Supine     Supine to sit:  Total assist Sit to supine: Total assist   General bed mobility comments: pt not resistant but also not assisting; does not follow any commands or multimodal cues    Transfers                        Ambulation/Gait                  Stairs            Wheelchair Mobility    Modified Rankin (Stroke Patients Only)       Balance Overall balance assessment: Needs assistance Sitting-balance support: Feet supported, No upper extremity supported Sitting balance-Leahy Scale: Fair                                       Pertinent Vitals/Pain Pain Assessment Breathing: normal Negative Vocalization: none Facial Expression: smiling or inexpressive Body Language: relaxed Consolability: no need to console PAINAD Score: 0    Home Living Family/patient expects to be discharged to:: Private residence Living Arrangements: Children                      Prior Function Prior Level of Function : Needs assist;Patient poor historian/Family not available             Mobility Comments: mostly w/c, able to ambulate with +2 assist from family per chart review; pt nonverbal and hx of dementia       Hand Dominance        Extremity/Trunk Assessment   Upper Extremity Assessment Upper Extremity  Assessment: LUE deficits/detail LUE Deficits / Details: edematous Left upper arm (RN aware); pt mostly keeps UEs in flexion as well    Lower Extremity Assessment Lower Extremity Assessment: RLE deficits/detail;LLE deficits/detail RLE Deficits / Details: bil flexion tone observed       Communication      Cognition Arousal/Alertness: Awake/alert Behavior During Therapy: Flat affect Overall Cognitive Status: History of cognitive impairments - at baseline                                 General Comments: hx dementia, nonverbal at baseline        General Comments      Exercises     Assessment/Plan    PT Assessment  Patient does not need any further PT services  PT Problem List         PT Treatment Interventions      PT Goals (Current goals can be found in the Care Plan section)  Acute Rehab PT Goals PT Goal Formulation: Patient unable to participate in goal setting    Frequency       Co-evaluation               AM-PAC PT "6 Clicks" Mobility  Outcome Measure Help needed turning from your back to your side while in a flat bed without using bedrails?: Total Help needed moving from lying on your back to sitting on the side of a flat bed without using bedrails?: Total Help needed moving to and from a bed to a chair (including a wheelchair)?: Total Help needed standing up from a chair using your arms (e.g., wheelchair or bedside chair)?: Total Help needed to walk in hospital room?: Total Help needed climbing 3-5 steps with a railing? : Total 6 Click Score: 6    End of Session   Activity Tolerance: Other (comment) (limited by cognition) Patient left: in bed;with call bell/phone within reach;with bed alarm set Nurse Communication: Mobility status      Time: 5102-5852 PT Time Calculation (min) (ACUTE ONLY): 18 min   Charges:   PT Evaluation $PT Eval Low Complexity: 1 Low        Kati PT, DPT Physical Therapist Acute Rehabilitation Services Preferred contact method: Secure Chat Weekend Pager Only: [phone] Office: [place] 03/01/2022, 11:51 AM

## 2022-03-01 NOTE — TOC Initial Note (Signed)
Transition of Care [facility]) - Initial/Assessment Note    Patient Details  Name: Sandra Jennings MRN: [id number] Date of Birth: January 13, 1941  Transition of Care [facility]) CM/SW Contact:    Leeroy Cha, RN Phone Number: 03/01/2022, 9:53 AM  Clinical Narrative:                  Transition of Care [facility]) Screening Note   Patient Details  Name: Sandra Jennings Date of Birth: 1940/12/15   Transition of Care [facility]) CM/SW Contact:    Leeroy Cha, RN Phone Number: 03/01/2022, 9:53 AM    Transition of Care Department [facility]) has reviewed patient and no TOC needs have been identified at this time. We will continue to monitor patient advancement through interdisciplinary progression rounds. If new patient transition needs arise, please place a TOC consult.    Expected Discharge Plan: Home/Self Care Barriers to Discharge: Continued Medical Work up   Patient Goals and CMS Choice Patient states their goals for this hospitalization and ongoing recovery are:: unable to state CMS Medicare.gov Compare Post Acute Care list provided to:: Legal Guardian Choice offered to / list presented to : [facility] POA / Guardian  Expected Discharge Plan and Services Expected Discharge Plan: Home/Self Care   Discharge Planning Services: CM Consult   Living arrangements for the past 2 months: Single Family Home                                      Prior Living Arrangements/Services Living arrangements for the past 2 months: Single Family Home Lives with:: Adult Children Patient language and need for interpreter reviewed:: Yes Do you feel safe going back to the place where you live?: Yes            Criminal Activity/Legal Involvement Pertinent to Current Situation/Hospitalization: No - Comment as needed  Activities of Daily Living Home Assistive Devices/Equipment: Eyeglasses ADL Screening (condition at time of admission) Patient's cognitive ability adequate to safely complete daily activities?:  No Is the patient deaf or have difficulty hearing?: No Does the patient have difficulty seeing, even when wearing glasses/contacts?: No Does the patient have difficulty concentrating, remembering, or making decisions?: Yes Patient able to express need for assistance with ADLs?: No Does the patient have difficulty dressing or bathing?: Yes Independently performs ADLs?: No Communication: Dependent Does the patient have difficulty walking or climbing stairs?: Yes Weakness of Legs: Both Weakness of Arms/Hands: None  Permission Sought/Granted                  Emotional Assessment Appearance:: Appears stated age Attitude/Demeanor/Rapport: Guarded Affect (typically observed): Accepting Orientation: : Oriented to Self, Oriented to Place Alcohol / Substance Use: Never Used Psych Involvement: No (comment)  Admission diagnosis:  GI bleed [K92.2] Symptomatic anemia [D64.9] Patient Active Problem List   Diagnosis Date Noted   GI bleed 02/28/2022   Pressure injury of skin 02/28/2022   H/O diabetes mellitus 09/20/2006   DEGENERATIVE JOINT DISEASE, KNEES, BILATERAL 07/07/2006   INJURY, 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 06/23/2006   HYPERTENSION, BENIGN SYSTEMIC 06/23/2006   PCP:  [facility] [place]:   [id number] - [place], [place] [address] [place] [address] Phone: [phone] Fax: [phone]     Social Determinants of Health (SDOH) Interventions    Readmission Risk  Interventions   No data to display

## 2022-03-01 NOTE — Progress Notes (Signed)
OT Cancellation Note  Patient Details Name: Arden Axon MRN: [id number] DOB: 1940/11/14   Cancelled Treatment:    Reason Eval/Treat Not Completed: Patient not medically ready Patients Hgb is 5.0 this AM with patient currently receiving PRBCs. OT to continue to follow and check back as schedule will allow.  Rennie Plowman, MS Acute Rehabilitation Department Office# [phone]  03/01/2022, 7:12 AM

## 2022-03-02 ENCOUNTER — Encounter (HOSPITAL_COMMUNITY)
Admission: EM | Disposition: A | Discharge status: Hospice | Payer: Self-pay | Source: Home / Self Care | Attending: Internal Medicine

## 2022-03-02 DIAGNOSIS — D500 Iron deficiency anemia secondary to blood loss (chronic): Secondary | ICD-10-CM

## 2022-03-02 DIAGNOSIS — R160 Hepatomegaly, not elsewhere classified: Secondary | ICD-10-CM | POA: Diagnosis present

## 2022-03-02 DIAGNOSIS — D649 Anemia, unspecified: Secondary | ICD-10-CM

## 2022-03-02 LAB — HEMOGLOBIN AND HEMATOCRIT, BLOOD
HCT: 21.3 % — ABNORMAL LOW (ref 36.0–46.0)
HCT: 32.9 % — ABNORMAL LOW (ref 36.0–46.0)
Hemoglobin: 6.9 g/dL — CL (ref 12.0–15.0)
Hemoglobin: 11.1 g/dL — ABNORMAL LOW (ref 12.0–15.0)

## 2022-03-02 LAB — IRON AND TIBC
Iron: 177 ug/dL — ABNORMAL HIGH (ref 28–170)
Saturation Ratios: 88 % — ABNORMAL HIGH (ref 10.4–31.8)
TIBC: 202 ug/dL — ABNORMAL LOW (ref 250–450)
UIBC: 25 ug/dL

## 2022-03-02 LAB — GLUCOSE, CAPILLARY
Glucose-Capillary: 101 mg/dL — ABNORMAL HIGH (ref 70–99)
Glucose-Capillary: 79 mg/dL (ref 70–99)
Glucose-Capillary: 83 mg/dL (ref 70–99)

## 2022-03-02 LAB — PREPARE RBC (CROSSMATCH)

## 2022-03-02 LAB — FOLATE: Folate: 31.4 ng/mL (ref >5.9)

## 2022-03-02 LAB — RETICULOCYTES
Immature Retic Fract: 24.2 % — ABNORMAL HIGH (ref 2.3–15.9)
RBC.: 2.58 MIL/uL — ABNORMAL LOW (ref 3.87–5.11)
Retic Count, Absolute: 49 10*3/uL (ref 19.0–186.0)
Retic Ct Pct: 1.9 % (ref 0.4–3.1)

## 2022-03-02 LAB — VITAMIN B12: Vitamin B-12: 2093 pg/mL — ABNORMAL HIGH (ref 180–914)

## 2022-03-02 SURGERY — ESOPHAGOGASTRODUODENOSCOPY (EGD) WITH PROPOFOL
Anesthesia: Monitor Anesthesia Care

## 2022-03-02 MED ORDER — DIPHENHYDRAMINE HCL 50 MG/ML IJ SOLN: 12.5000 mg | INTRAMUSCULAR | Status: DC | PRN

## 2022-03-02 MED ORDER — ONDANSETRON 4 MG PO TBDP: 4.0000 mg | ORAL_TABLET | Freq: Four times a day (QID) | ORAL | Status: DC | PRN

## 2022-03-02 MED ORDER — LORAZEPAM 2 MG/ML IJ SOLN: 1.0000 mg | INTRAMUSCULAR | Status: DC | PRN

## 2022-03-02 MED ORDER — LORAZEPAM 1 MG PO TABS: 1.0000 mg | ORAL_TABLET | ORAL | Status: DC | PRN

## 2022-03-02 MED ORDER — ONDANSETRON HCL 4 MG/2ML IJ SOLN: 4.0000 mg | Freq: Four times a day (QID) | INTRAMUSCULAR | Status: DC | PRN

## 2022-03-02 MED ORDER — OXYCODONE HCL 20 MG/ML PO CONC: 5.0000 mg | ORAL | Status: DC | PRN

## 2022-03-02 MED ORDER — LORAZEPAM 2 MG/ML IJ SOLN
1.0000 mg | INTRAMUSCULAR | Status: DC | PRN
  Administered 2022-03-02: 1 mg via INTRAVENOUS
  Filled 2022-03-02: qty 1

## 2022-03-02 MED ORDER — GLYCOPYRROLATE 1 MG PO TABS: 1.0000 mg | ORAL_TABLET | ORAL | Status: DC | PRN

## 2022-03-02 MED ORDER — HALOPERIDOL LACTATE 5 MG/ML IJ SOLN: 0.5000 mg | INTRAMUSCULAR | Status: DC | PRN

## 2022-03-02 MED ORDER — SODIUM CHLORIDE 0.9% IV SOLUTION: Freq: Once | INTRAVENOUS | Status: DC

## 2022-03-02 MED ORDER — HALOPERIDOL LACTATE 2 MG/ML PO CONC
0.5000 mg | ORAL | Status: DC | PRN
  Filled 2022-03-02: qty 5

## 2022-03-02 MED ORDER — GLYCOPYRROLATE 0.2 MG/ML IJ SOLN: 0.2000 mg | INTRAMUSCULAR | Status: DC | PRN

## 2022-03-02 MED ORDER — HALOPERIDOL 0.5 MG PO TABS
0.5000 mg | ORAL_TABLET | ORAL | Status: DC | PRN
  Filled 2022-03-02: qty 1

## 2022-03-02 MED ORDER — MORPHINE SULFATE (PF) 2 MG/ML IV SOLN
1.0000 mg | INTRAVENOUS | Status: DC | PRN
  Administered 2022-03-04: 1 mg via INTRAVENOUS
  Filled 2022-03-02: qty 1

## 2022-03-02 MED ORDER — GLYCOPYRROLATE 0.2 MG/ML IJ SOLN
0.2000 mg | INTRAMUSCULAR | Status: DC | PRN
  Administered 2022-03-02: 0.2 mg via INTRAVENOUS
  Filled 2022-03-02: qty 1

## 2022-03-02 MED ORDER — LORAZEPAM 2 MG/ML PO CONC: 1.0000 mg | ORAL | Status: DC | PRN

## 2022-03-02 NOTE — Progress Notes (Signed)
OT Cancellation Note  Patient Details Name: Sandra Jennings MRN: [id number] DOB: [birth year]   Cancelled Treatment:    Reason Eval/Treat Not Completed: Other (comment) Per PT and nurse, patient is TD at baseline with inability to follow commands. Recommend patient continue to have 24/7 caregiver support in next level of care. OT to sign off at this time.  Rennie Plowman, MS Acute Rehabilitation Department Office# [phone]  03/02/2022, 2:36 PM

## 2022-03-02 NOTE — Progress Notes (Addendum)
[facility] Progress Note  CC:   Anemia, positive FOBT   Subjective: Patient is nonverbal.  She opened her eyes with verbal stimulation.  No family at bedside.   Objective:  Vital signs in last 24 hours: Temp:  [97.7 F (36.5 C)-98.7 F (37.1 C)] 97.8 F (36.6 C) (11/07 0751) Pulse Rate:  [51-71] 55 (11/07 0751) Resp:  [13-29] 13 (11/07 0751) BP: (127-170)/(44-115) 147/58 (11/07 0751) SpO2:  [61 %-100 %] 100 % (11/07 0654) Last BM Date : 03/01/22 General: Cachectic appearing 81 year old female. Heart: Regular rate and rhythm, no murmur. Breasts: Large left breast mass. Pulm: Breath sounds diminished throughout. Abdomen: Soft, nondistended.  Hypoactive bowel sounds to all quadrants.  No palpable mass. Extremities: Lower extremities stiff with contractures, no edema. Neurologic: Eyes open to verbal stimuli.  Patient does not follow commands.  She is nonverbal. Psych:  Alert and cooperative. Normal mood and affect.  Intake/Output from previous day: 11/06 0701 - 11/07 0700 In: 1515.5 [I.V.:549.5; Blood:966] Out: -  Intake/Output this shift: No intake/output data recorded.  Lab Results: Recent Labs    02/28/22 1955 03/01/22 0358 03/01/22 1051 03/01/22 1605 03/02/22 0453  WBC 5.4 5.2  --  5.1  --   HGB 5.4* 5.0* 6.6* 5.1* 6.9*  HCT 17.9* 16.1* 21.3* 16.3* 21.3*  PLT 197 213  --  190  --    BMET Recent Labs    02/28/22 1402 03/01/22 0358  NA 143 142  K 4.4 4.2  CL 114* 116*  CO2 23 20*  GLUCOSE 112* 85  BUN 36* 35*  CREATININE 1.22* 1.16*  CALCIUM 8.8* 8.6*   LFT Recent Labs    03/01/22 0358  PROT 5.9*  ALBUMIN 2.8*  AST 64*  ALT 26  ALKPHOS 85  BILITOT 0.6   PT/INR Recent Labs    03/01/22 0358 03/01/22 1731  LABPROT 16.1* 15.9*  INR 1.3* 1.3*   Hepatitis Panel No results for input(s): "HEPBSAG", "HCVAB", "HEPAIGM", "HEPBIGM" in the last 72 hours.  DG CHEST PORT 1 VIEW  Result Date: 03/01/2022 CLINICAL DATA:  Shortness  of breath. EXAM: PORTABLE CHEST 1 VIEW COMPARISON:  February 28, 2022 FINDINGS: The heart size and mediastinal contours are within normal limits. There is moderate severity calcification of the aortic arch. Calcified mediastinal lymph nodes are seen on the right. The lungs are hyperinflated. Both lungs are clear. A left breast prosthesis is noted. Degenerative changes are seen throughout the thoracic spine. IMPRESSION: 1. Stable exam without evidence of acute cardiopulmonary disease. Electronically Signed   By: Aram Candela M.D.   On: 03/01/2022 21:32   CT ANGIO GI BLEED  Result Date: 03/01/2022 CLINICAL DATA:  Lower GI bleed. EXAM: CTA ABDOMEN AND PELVIS WITHOUT AND WITH CONTRAST TECHNIQUE: Multidetector CT imaging of the abdomen and pelvis was performed using the standard protocol during bolus administration of intravenous contrast. Multiplanar reconstructed images and MIPs were obtained and reviewed to evaluate the vascular anatomy. RADIATION DOSE REDUCTION: This exam was performed according to the departmental dose-optimization program which includes automated exposure control, adjustment of the mA and/or kV according to patient size and/or use of iterative reconstruction technique. CONTRAST:  OMNIPAQUE IOHEXOL 350 MG/ML SOLN COMPARISON:  None Available. FINDINGS: VASCULAR Aorta: Normal caliber aorta without aneurysm, dissection, vasculitis or significant stenosis. Tortuosity with moderate atherosclerosis. Celiac: Mild plaque at the origin, widely patent distally. No dissection or acute findings. SMA: Mild plaque at the origin, widely patent distally. No dissection or acute findings. Renals:  Both renal arteries are patent without evidence of aneurysm, dissection, vasculitis, fibromuscular dysplasia or significant stenosis. IMA: Patent without evidence of aneurysm, dissection, vasculitis or significant stenosis. Inflow: Patent without evidence of aneurysm, dissection, vasculitis or significant  stenosis. Proximal Outflow: Bilateral common femoral and visualized portions of the superficial and profunda femoral arteries are patent without evidence of aneurysm, dissection, vasculitis or significant stenosis. Veins: No acute venous abnormality on venous phase imaging. Review of the MIP images confirms the above findings. NON-VASCULAR Lower chest: No basilar airspace disease or pleural effusion. Deflated right breast implant. Grossly intact left breast implant. Hepatobiliary: Heterogeneously enhancing large low-density central mass in the liver spans at least 10.1 cm. This is poorly defined, for example series 9, image 14 and series 7, image 14. Moderate gallbladder distension. Common bile duct is normal. There is mild central intrahepatic biliary ductal dilatation. Calcified granuloma in the right hepatic lobe. Arms down positioning and motion limits assessment. Pancreas: Not well-defined on the current exam due to paucity of intra-abdominal fat, likely atrophic. Spleen: Normal in size. Nonspecific 13 mm low-density lesion in the posterior spleen. There are splenic granuloma. Adrenals/Urinary Tract: No definite adrenal nodule. No hydronephrosis. There are multiple right renal cysts. No further imaging follow-up is needed. No evidence of solid renal mass. Partially distended urinary bladder without wall thickening. Stomach/Bowel: There is no contrast extravasation into the GI tract to localize site of GI bleed. Moderate volume of stool throughout the colon. Please note that detailed bowel assessment is limited given paucity of intra-abdominal fat, streak artifact from arms down positioning and lack of enteric contrast. There is no bowel dilatation or obstruction. No gross bowel inflammation. The stomach is decompressed. Lymphatic: Assessment for adenopathy significantly limited. There are no obvious bulky enlarged nodes. Reproductive: The endometrium measures 7 mm, abnormal for age. No visible adnexal mass.  Other: Generalized paucity of body fat suggesting cachexia. There is diffuse subcutaneous and intra-abdominal edema suggestive of third spacing. Possible trace perihepatic ascites. No free intra-abdominal air. Musculoskeletal: The bones are diffusely under mineralized. Diffuse fusion of the posterior elements presumably postsurgical. There is fusion of L1 and L2 vertebral bodies. No evidence of focal bone lesion. There is fatty atrophy of the paraspinal musculature. IMPRESSION: 1. No contrast extravasation into the GI tract to localize site of GI bleed. 2. Heterogeneously enhancing large low-density central mass in the liver spanning at least 10.1 cm. This is poorly defined. Findings are suspicious for metastatic disease or primary hepatobiliary malignancy. While typically MRI would be recommended for characterization, given patient's advanced age and motion on CT, this would be of doubtful diagnostic value, recommend initial assessment with ultrasound. 3. The endometrium measures 7 mm, abnormal for age. Recommend nonemergent pelvic ultrasound. 4. Generalized paucity of body fat suggesting cachexia. Diffuse subcutaneous and intra-abdominal edema suggestive of third spacing. Possible trace perihepatic ascites. 5. Nonspecific 13 mm low-density lesion in the posterior spleen. Aortic Atherosclerosis (ICD10-I70.0). Electronically Signed   By: Keith Rake M.D.   On: 03/01/2022 18:14   CT HEAD WO CONTRAST (5MM)  Result Date: 02/28/2022 CLINICAL DATA:  81 year old female with altered mental status. EXAM: CT HEAD WITHOUT CONTRAST TECHNIQUE: Contiguous axial images were obtained from the base of the skull through the vertex without intravenous contrast. RADIATION DOSE REDUCTION: This exam was performed according to the departmental dose-optimization program which includes automated exposure control, adjustment of the mA and/or kV according to patient size and/or use of iterative reconstruction technique. COMPARISON:   02/22/2013 MR FINDINGS: Brain: No evidence  of acute infarction, hemorrhage, extra-axial collection or mass lesion/mass effect. Mild ventriculomegaly has increased since 2014 and may be related to central atrophy/chronic white matter changes. Hydrocephalus is difficult to entirely exclude. Atrophy and severe chronic small-vessel white matter ischemic changes again noted. Vascular: Carotid and vertebral atherosclerotic calcifications are noted. Skull: Normal. Negative for fracture or focal lesion. Sinuses/Orbits: No acute finding. Other: None IMPRESSION: 1. Mild ventriculomegaly which is more likely related to central atrophy/chronic white matter changes, but mild communicating hydrocephalus this difficult to entirely exclude. 2. No other acute abnormalities noted. 3. Atrophy and severe chronic small-vessel white matter ischemic changes. Electronically Signed   By: Harmon Pier M.D.   On: 02/28/2022 14:18   DG Chest Portable 1 View  Result Date: 02/28/2022 CLINICAL DATA:  Altered mental status. EXAM: PORTABLE CHEST 1 VIEW COMPARISON:  03/03/2020 chest radiograph FINDINGS: The cardiomediastinal silhouette is not significantly changed. There is no evidence of focal airspace disease, pulmonary edema, suspicious pulmonary nodule/mass, pleural effusion, or pneumothorax. No acute bony abnormalities are identified. Density overlying the LEFT chest/breast again noted, question prosthesis. IMPRESSION: No active disease. Electronically Signed   By: Harmon Pier M.D.   On: 02/28/2022 14:13    Assessment / Plan:  46) 81 year old female with chronic IDA admitted to the hospital with profound anemia, positive FOBT.  Admission hemoglobin 5.7 -> transfused 2 units of PRBCs -> Hg 5.0.  Third unit of PRBCs infiltrated 11/6 -> Fourth unit PRBC transfused -> today Hg 6.9 -> one unit of PRBCs transfusing at this time. Iron 177. B12 level 2, 093. Abd/pelvic CTA 11/6 without evidence of active GI bleeding. -Transfuse for hemoglobin  less than 7 -Check H/H post transfusion. -NPO -Pantoprazole 40 mg IV twice daily -Ondansetron 4 mg p.o. or IV every 6 hours as needed -EGD scheduled this afternoon cancelled due to continued decline in Hg level requiring blood transfusions and new liver mass identified per CTA -I attempted to contact the patient's son at this time but did not reach him.   2) Central liver mass measuring 10.1 cm identified per CTA which is suspicious for metastatic disease or primary hepatobiliary malignancy. -RUQ sonogram -AFP -Will need to discuss liver mass findings with son -Recommend palliative care, defer to Dr.Patel   3) Progressive dementia   4) History of CVA    Principal Problem:   GI bleed Active Problems:   H/O diabetes mellitus   ANEMIA, IRON DEFICIENCY, UNSPEC.   HYPERTENSION, BENIGN SYSTEMIC   Pressure injury of skin   Symptomatic anemia     LOS: 1 day   Arnaldo Natal  03/02/2022, 7:57 AM

## 2022-03-02 NOTE — Progress Notes (Signed)
Patient's son is stating he would like his mother to rest for now and have [place] wait until day shift to perform her wound care.

## 2022-03-02 NOTE — TOC Progression Note (Signed)
Transition of Care [facility]) - Progression Note    Patient Details  Name: Sandra Jennings MRN: [id number] Date of Birth: Nov 26, 1940  [facility]) CM/SW Contact  Leeroy Cha, RN Phone Number: 03/02/2022, 8:19 AM  Clinical Narrative:    Terrilyn Saver from [facility] CASE mANAGER [phone], [phone].    Expected Discharge Plan: Home/Self Care Barriers to Discharge: Continued Medical Work up  Expected Discharge Plan and Services Expected Discharge Plan: Home/Self Care   Discharge Planning Services: CM Consult   Living arrangements for the past 2 months: Single Family Home                                       Social Determinants of Health (SDOH) Interventions    Readmission Risk Interventions   No data to display

## 2022-03-02 NOTE — Progress Notes (Signed)
[facility] Progress Note Patient: Sandra Jennings TOI:[id number] DOB: 26-Oct-1940 DOA: 02/28/2022  DOS: the patient was seen and examined on 03/02/2022  Brief hospital course:  Sandra Jennings is a 81 y.o. female with PMH significant of dementia, HTN, CVA with hypertension presented to the hospital with complaints of syncopal event.  Found to have symptomatic anemia with hemoglobin of 5.7.  After 3 PRBC transfusion hemoglobin remains at 5.  No active bleeding seen so far.  CT abdomen negative for any active bleeding.  Initiate hemolytic work-up.  GI consulted for complaints of melena.  Patient's hemoglobin did not respond to multiple transfusion well. 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 On further discussion with family with regards to goals of care decision was made to transition to complete comfort with DNR/DNI with goal to go home with hospice. Assessment and Plan: Symptomatic anemia. Chronic upper GI bleed with melena Patient presented with syncopal events. On presentation hemoglobin was 5.7.  It appears at her baseline her hemoglobin is around 9. Patient received 2 PRBC transfusion with her subsequent hemoglobin of around 5. After another PRBC transfusion her hemoglobin came up to 6.6 but then immediately dropped down to 5.1 and few hours. Last hemoglobin was 6.9. Reticulocyte count normal.  INR 1.3.  Fibrinogen level normal. While iron levels are elevated most likely this is posttransfusion therefore not reliable.  LFTs are mildly elevated.  B12 level 2000.  Bilirubin level normal. CT abdomen negative for any intra-abdominal bleeding.  Also there is no evidence of any pleural fluid concerning for intrathoracic bleeding.  Thus essentially after 6 units of blood and 72 hours the patient's hemoglobin improved from 5.7-6.9.  This is highly concerning for a possible hemolytic process and renders very poor prognosis. Patient does have chronic GI bleed although no  significant bleeding seen here in the hospital. GI was consulted and initial plan was to consider an endoscopy although after her lack of response to transfusion and possibility of liver malignancy recommended to treat conservatively and preferentially transition to comfort.  Liver mass. CT angio shows evidence of a 10 cm liver mass. Concerning for liver malignancy. Renders poor prognosis.  Acute hypoxia, Post CT scan the patient was briefly hypoxic. Chest x-ray was unremarkable. Currently resolved.  Dementia. History of CVA. At her baseline the patient is nonverbal. Minimally active. Monitor.  Syncope. Patient presented with 2 episodes of syncope at home. No focal deficit at the time of my evaluation. Less likely cardiac in origin and mostly stayed with her anemia.  Poor p.o. intake. Per son the patient actually has been declining for the last few months, and rapidly lately with poor p.o. intake.  Underweight. Abdomen to thrive. Patient Body mass index is 16.92 kg/m.  This renders very poor prognosis.  Goals of care conversation. Extensive discussion with son at bedside with regards to patient's goals of care. Patient has no response to blood transfusion. This renders poor very poor prognosis. No active significant bleeding seen from the outside. CT scan negative for intra-abdominal bleeding.  No evidence of intrathoracic bleeding. Also on top of this patient has dementia with poor p.o. intake and progressive decline for last few months. Patient is underweight with a BMI less than 17. And patient appears to be having a liver malignancy. With all this patient appears to be a poor candidate for endoscopy with high risk for poor outcomes. Based on the discussion about above with the son, currently plan is to transition to complete comfort in  the hospital with goal to transition to home with home hospice on discharge.  Subjective: Patient remains nonverbal.  Occasional  words.  Unable to follow any commands.  No fever no chills.  Physical Exam: General: in mild distress;  Cardiovascular: S1 and S2 Present, no Murmur Respiratory: normal respiratory effort, Bilateral Air entry present, no Crackles, no wheezes Abdomen: Bowel Sound present, Non tender  Extremities: no edema Neurology: alert and non verbal   Data Reviewed: I have Reviewed nursing notes, Vitals, and Lab results. Since last encounter, pertinent lab results CBC and BMP   . I have discussed pt's care plan and test results with GI  .   Disposition: Status is: Inpatient Remains inpatient appropriate because: Need hospice on discharge.   Family Communication: Discussed with son at bedside. Level of care: Transition to MedSurg. Vitals:   03/02/22 0951 03/02/22 1007 03/02/22 1228 03/02/22 1245  BP: (!) 150/99 (!) 145/85 (!) 193/166 (!) 154/111  Pulse: 67 (!) 59 67 67  Resp: 16 17  16   Temp: 98 F (36.7 C) 97.9 F (36.6 C) 98.2 F (36.8 C) 98.2 F (36.8 C)  TempSrc: Axillary  Oral Axillary  SpO2: 100%  100% 96%  Weight:      Height:         Author: , MD 03/02/2022 9:11 PM  Please look on [email or web address] to find out who is on call.

## 2022-03-03 DIAGNOSIS — Z8639 Personal history of other endocrine, nutritional and metabolic disease: Secondary | ICD-10-CM

## 2022-03-03 DIAGNOSIS — R160 Hepatomegaly, not elsewhere classified: Secondary | ICD-10-CM

## 2022-03-03 DIAGNOSIS — I10 Essential (primary) hypertension: Secondary | ICD-10-CM

## 2022-03-03 LAB — TYPE AND SCREEN
ABO/RH(D): B POS
Antibody Screen: NEGATIVE
Unit division: 0
Unit division: 0
Unit division: 0
Unit division: 0
Unit division: 0
Unit division: 0

## 2022-03-03 LAB — BPAM RBC
Blood Product Expiration Date: 202311162359
Blood Product Expiration Date: 202311212359
Blood Product Expiration Date: 202311212359
Blood Product Expiration Date: 202311222359
Blood Product Expiration Date: 202311222359
Blood Product Expiration Date: 202311242359
ISSUE DATE / TIME: 202311052023
ISSUE DATE / TIME: 202311052346
ISSUE DATE / TIME: 202311060624
ISSUE DATE / TIME: 202311062031
ISSUE DATE / TIME: 202311070616
ISSUE DATE / TIME: 202311070946
Unit Type and Rh: 1700
Unit Type and Rh: 7300
Unit Type and Rh: 7300
Unit Type and Rh: 7300
Unit Type and Rh: 7300
Unit Type and Rh: 7300

## 2022-03-03 NOTE — TOC Progression Note (Addendum)
Transition of Care [facility]) - Progression Note    Patient Details  Name: Sandra Jennings MRN: [id number] Date of Birth: 23-Apr-1941  [facility] CM/SW Contact  Adrian Prows, RN Phone Number: 03/03/2022, 3:15 PM  Clinical Narrative:    [facility] consult for home hospice; contacted pt's son [name] ([phone]); he says the pt is followed by [facility] and [name] has been trying to contact [facility] office; the pt also says the hospital bed has been delivered to the home; address verified; called [facility] [phone]) and transferred to [facility]; LVM; awaiting return call.  - [phone] -Bree from [facility] called back; she says their doctors and agency can manage the pt from a hospice standpoint and no hospice consult is needed; [name] also says they can transport the pt; she also gave her contact info [phone] (office,) and [phone] (cell); will TOC will con't to follow.  - 1600- notified Bree at [facility] that d/c planned for tomorrow; she says they can assume care and will contact the pt's son. Expected Discharge Plan: Home/Self Care Barriers to Discharge: Continued Medical Work up  Expected Discharge Plan and Services Expected Discharge Plan: Home/Self Care   Discharge Planning Services: CM Consult   Living arrangements for the past 2 months: Single Family Home                                       Social Determinants of Health (SDOH) Interventions    Readmission Risk Interventions     No data to display

## 2022-03-03 NOTE — Progress Notes (Signed)
[facility] Progress Note  CC:   Anemia, positive FOBT    Subjective:  Patient is nonverbal.  She opened her eyes with verbal stimulation.  No family at bedside.   Objective:  Vital signs in last 24 hours: Temp:  [98.2 F (36.8 C)] 98.2 F (36.8 C) (11/07 1245) Pulse Rate:  [56-67] 56 (11/08 0347) Resp:  [12-16] 12 (11/08 0347) BP: (150-193)/(84-166) 150/84 (11/08 0347) SpO2:  [85 %-100 %] 85 % (11/08 0347) Last BM Date : 03/01/22 General: Cachectic appearing 81 year old female, resting calmly.  Heart: Regular rate and rhythm, no murmur. Left breast implant mass.  Pulm: Breath sounds diminished throughout.  Abdomen: Soft, nondistended.  Hypoactive bowel sounds to all quadrants.  No palpable mass  Extremities: Lower extremities stiff with contractures, no edema.  Neurologic: Eyes open to verbal stimuli.  Patient does not follow commands.  She is nonverbal.   Intake/Output from previous day: 11/07 0701 - 11/08 0700 In: 1282.2 [I.V.:201.2; Blood:1081] Out: 200 [Urine:200] Intake/Output this shift: No intake/output data recorded.  Lab Results: Recent Labs    02/28/22 1955 03/01/22 0358 03/01/22 1051 03/01/22 1605 03/02/22 0453 03/02/22 2112  WBC 5.4 5.2  --  5.1  --   --   HGB 5.4* 5.0*   < > 5.1* 6.9* 11.1*  HCT 17.9* 16.1*   < > 16.3* 21.3* 32.9*  PLT 197 213  --  190  --   --    < > = values in this interval not displayed.   BMET Recent Labs    02/28/22 1402 03/01/22 0358  NA 143 142  K 4.4 4.2  CL 114* 116*  CO2 23 20*  GLUCOSE 112* 85  BUN 36* 35*  CREATININE 1.22* 1.16*  CALCIUM 8.8* 8.6*   LFT Recent Labs    03/01/22 0358  PROT 5.9*  ALBUMIN 2.8*  AST 64*  ALT 26  ALKPHOS 85  BILITOT 0.6   PT/INR Recent Labs    03/01/22 0358 03/01/22 1731  LABPROT 16.1* 15.9*  INR 1.3* 1.3*   Hepatitis Panel No results for input(s): "HEPBSAG", "HCVAB", "HEPAIGM", "HEPBIGM" in the last 72 hours.  DG CHEST PORT 1 VIEW  Result  Date: 03/01/2022 CLINICAL DATA:  Shortness of breath. EXAM: PORTABLE CHEST 1 VIEW COMPARISON:  February 28, 2022 FINDINGS: The heart size and mediastinal contours are within normal limits. There is moderate severity calcification of the aortic arch. Calcified mediastinal lymph nodes are seen on the right. The lungs are hyperinflated. Both lungs are clear. A left breast prosthesis is noted. Degenerative changes are seen throughout the thoracic spine. IMPRESSION: 1. Stable exam without evidence of acute cardiopulmonary disease. Electronically Signed   By: Aram Candela M.D.   On: 03/01/2022 21:32   CT ANGIO GI BLEED  Result Date: 03/01/2022 CLINICAL DATA:  Lower GI bleed. EXAM: CTA ABDOMEN AND PELVIS WITHOUT AND WITH CONTRAST TECHNIQUE: Multidetector CT imaging of the abdomen and pelvis was performed using the standard protocol during bolus administration of intravenous contrast. Multiplanar reconstructed images and MIPs were obtained and reviewed to evaluate the vascular anatomy. RADIATION DOSE REDUCTION: This exam was performed according to the departmental dose-optimization program which includes automated exposure control, adjustment of the mA and/or kV according to patient size and/or use of iterative reconstruction technique. CONTRAST:  OMNIPAQUE IOHEXOL 350 MG/ML SOLN COMPARISON:  None Available. FINDINGS: VASCULAR Aorta: Normal caliber aorta without aneurysm, dissection, vasculitis or significant stenosis. Tortuosity with moderate atherosclerosis. Celiac: Mild plaque at the  origin, widely patent distally. No dissection or acute findings. SMA: Mild plaque at the origin, widely patent distally. No dissection or acute findings. Renals: Both renal arteries are patent without evidence of aneurysm, dissection, vasculitis, fibromuscular dysplasia or significant stenosis. IMA: Patent without evidence of aneurysm, dissection, vasculitis or significant stenosis. Inflow: Patent without evidence of aneurysm,  dissection, vasculitis or significant stenosis. Proximal Outflow: Bilateral common femoral and visualized portions of the superficial and profunda femoral arteries are patent without evidence of aneurysm, dissection, vasculitis or significant stenosis. Veins: No acute venous abnormality on venous phase imaging. Review of the MIP images confirms the above findings. NON-VASCULAR Lower chest: No basilar airspace disease or pleural effusion. Deflated right breast implant. Grossly intact left breast implant. Hepatobiliary: Heterogeneously enhancing large low-density central mass in the liver spans at least 10.1 cm. This is poorly defined, for example series 9, image 14 and series 7, image 14. Moderate gallbladder distension. Common bile duct is normal. There is mild central intrahepatic biliary ductal dilatation. Calcified granuloma in the right hepatic lobe. Arms down positioning and motion limits assessment. Pancreas: Not well-defined on the current exam due to paucity of intra-abdominal fat, likely atrophic. Spleen: Normal in size. Nonspecific 13 mm low-density lesion in the posterior spleen. There are splenic granuloma. Adrenals/Urinary Tract: No definite adrenal nodule. No hydronephrosis. There are multiple right renal cysts. No further imaging follow-up is needed. No evidence of solid renal mass. Partially distended urinary bladder without wall thickening. Stomach/Bowel: There is no contrast extravasation into the GI tract to localize site of GI bleed. Moderate volume of stool throughout the colon. Please note that detailed bowel assessment is limited given paucity of intra-abdominal fat, streak artifact from arms down positioning and lack of enteric contrast. There is no bowel dilatation or obstruction. No gross bowel inflammation. The stomach is decompressed. Lymphatic: Assessment for adenopathy significantly limited. There are no obvious bulky enlarged nodes. Reproductive: The endometrium measures 7 mm, abnormal  for age. No visible adnexal mass. Other: Generalized paucity of body fat suggesting cachexia. There is diffuse subcutaneous and intra-abdominal edema suggestive of third spacing. Possible trace perihepatic ascites. No free intra-abdominal air. Musculoskeletal: The bones are diffusely under mineralized. Diffuse fusion of the posterior elements presumably postsurgical. There is fusion of L1 and L2 vertebral bodies. No evidence of focal bone lesion. There is fatty atrophy of the paraspinal musculature. IMPRESSION: 1. No contrast extravasation into the GI tract to localize site of GI bleed. 2. Heterogeneously enhancing large low-density central mass in the liver spanning at least 10.1 cm. This is poorly defined. Findings are suspicious for metastatic disease or primary hepatobiliary malignancy. While typically MRI would be recommended for characterization, given patient's advanced age and motion on CT, this would be of doubtful diagnostic value, recommend initial assessment with ultrasound. 3. The endometrium measures 7 mm, abnormal for age. Recommend nonemergent pelvic ultrasound. 4. Generalized paucity of body fat suggesting cachexia. Diffuse subcutaneous and intra-abdominal edema suggestive of third spacing. Possible trace perihepatic ascites. 5. Nonspecific 13 mm low-density lesion in the posterior spleen. Aortic Atherosclerosis (ICD10-I70.0). Electronically Signed   By: Narda Rutherford M.D.   On: 03/01/2022 18:14    Assessment / Plan:  13) 81 year old female with chronic IDA admitted to the hospital with profound anemia, positive FOBT.  Admission hemoglobin 5.7 -> transfused 2 units of PRBCs -> Hg 5.0.  Third unit of PRBCs infiltrated 11/6 -> Fourth unit PRBC transfused -> Hg 6.9 -> 11/7 -> transfused 2 more units of PRBCs -> Hg 11.1.  H/H not done this am.  No active GI bleeding. Possible hemolytic anemia. Iron 177. B12 level 2, 093. Abd/pelvic CTA 11/6 without evidence of active GI bleeding.  EGD 11/7 canceled  after CTA identified a large liver mass, likely malignant.  Palliative care was consulted and per discussion with family, patient was transitioned to comfort care, DNR/DNI with goal to go home with hospice. -Diet as tolerated -Pantoprazole 40 mg IV twice daily -Ondansetron 4 mg p.o. or IV every 6 hours as needed -Our GI service will sign off at this time   2) Central liver mass measuring 10.1 cm identified per CTA which is suspicious for metastatic disease or primary hepatobiliary malignancy.   3) Progressive dementia   4) History of CVA     Principal Problem:   GI bleed Active Problems:   H/O diabetes mellitus   ANEMIA, IRON DEFICIENCY, UNSPEC.   HYPERTENSION, BENIGN SYSTEMIC   Pressure injury of skin   Symptomatic anemia   Liver mass     LOS: 2 days   Arnaldo Natal  03/03/2022, 11:32 AM

## 2022-03-03 NOTE — Progress Notes (Signed)
PROGRESS NOTE    Sandra Jennings  CBJ:[id number] DOB: October 25, 1940 DOA: 02/28/2022 PCP: [facility]    Brief Narrative:  Sandra Jennings is a 81 y.o. female with past medical history of dementia, HTN, CVA with hypertension presented to the hospital with complaints of syncopal event.  Found to have symptomatic anemia with hemoglobin of 5.7 received 3 units of packed RBC.  CT abdomen negative for any active bleeding but hepatic mass concerning for malignancy. GI consulted for complaints of melena. On further discussion with family with regards to goals of care decision was made to transition to complete comfort with DNR/DNI with goal to go home with hospice.    Assessment and Plan: Principal Problem:   GI bleed Active Problems:   H/O diabetes mellitus   ANEMIA, IRON DEFICIENCY, UNSPEC.   HYPERTENSION, BENIGN SYSTEMIC   Pressure injury of skin   Symptomatic anemia   Liver mass  Symptomatic anemia. Chronic upper GI bleed with melena Syncope Presented with syncope.  Initial hemoglobin was 5.7.  Received 3 units of packed RBC.  Latest hemoglobin at 11.1. Reticulocyte count normal.  INR 1.3.  Fibrinogen level normal.  B12 level 2000.  Bilirubin level normal. CT abdomen negative for any intra-abdominal bleeding. Patient does have history of chronic GI bleed. GI was consulted and initial plan was to consider an endoscopy but due to possibility of liver malignancy recommended to treat conservatively and preferentially transition to comfort.  Patient is a poor candidate for endoscopy with high risk for poor outcomes.   Liver mass. CT angiogram showed evidence of a 10 cm liver mass concerning for liver malignancy.   Acute hypoxia, Transient and resolved.  Chest x-ray was unremarkable   Dementia/History of CVA. Patient is nonverbal at baseline.  Decreased oral intake, failure to thrive, underweight Per patient's family patient had been having poor oral intake and  has been declining recently. Patient Body mass index is 16.92 kg/m.  Poor prognosis.  Goals of care conversation. Previous provider had spoken with the patient's family plan is to transition to complete comfort care and home hospice on discharge.  Patient does have a history of dementia, poor p.o. intake and progressive decline for last few months with malnutrition and underlying liver mass putting her at high risk for poor outcomes.   DVT prophylaxis: None   Code Status:     Code Status: DNR  Disposition: Likely home with hospice Status is: Inpatient Remains inpatient appropriate because: GI bleed liver mass awaiting for hospice   Family Communication: None at bedside  Consultants:  GI  Procedures:  PRBC transfusion  Antimicrobials:  None  Anti-infectives (From admission, onward)    None      Subjective: Today, patient was seen and examined at bedside.  Patient is nonverbal.  Unable to follow commands.  Appears weak and deconditioned.  Objective: Vitals:   03/02/22 1007 03/02/22 1228 03/02/22 1245 03/03/22 0347  BP: (!) 145/85 (!) 193/166 (!) 154/111 (!) 150/84  Pulse: (!) 59 67 67 (!) 56  Resp: 17  16 12   Temp: 97.9 F (36.6 C) 98.2 F (36.8 C) 98.2 F (36.8 C)   TempSrc:  Oral Axillary   SpO2:  100% 96% (!) 85%  Weight:      Height:        Intake/Output Summary (Last 24 hours) at 03/03/2022 0743 Last data filed at 03/02/2022 1653 Gross per 24 hour  Intake 1282.21 ml  Output 200 ml  Net 1082.21 ml  Filed Weights   02/28/22 1332 03/02/22 0751  Weight: 49 kg 49 kg    Physical Examination: Body mass index is 16.92 kg/m.  General: Thinly built, not in obvious distress, nonverbal, elderly female, appears frail and deconditioned HENT: Mild pallor, oral mucosa is mildly dry. Chest:  Clear breath sounds.  . No crackles or wheezes.  CVS: S1 &S2 heard. No murmur.  Regular rate and rhythm. Abdomen: Soft, nontender, nondistended.  Bowel sounds are heard.    Extremities: No cyanosis, clubbing or edema.  Peripheral pulses are palpable. Psych: Awake but but nonverbal CNS: Underlying dementia, nonverbal, moves extremities. Skin: Warm and dry.  No rashes noted.  Data Reviewed:   CBC: Recent Labs  Lab 02/28/22 1402 02/28/22 1955 03/01/22 0358 03/01/22 1051 03/01/22 1605 03/02/22 0453 03/02/22 2112  WBC 4.8 5.4 5.2  --  5.1  --   --   HGB 5.7* 5.4* 5.0* 6.6* 5.1* 6.9* 11.1*  HCT 19.4* 17.9* 16.1* 21.3* 16.3* 21.3* 32.9*  MCV 82.6 81.0 78.9*  --  80.3  --   --   PLT 220 197 213  --  190  --   --     Basic Metabolic Panel: Recent Labs  Lab 02/28/22 1402 03/01/22 0358  NA 143 142  K 4.4 4.2  CL 114* 116*  CO2 23 20*  GLUCOSE 112* 85  BUN 36* 35*  CREATININE 1.22* 1.16*  CALCIUM 8.8* 8.6*    Liver Function Tests: Recent Labs  Lab 02/28/22 1402 03/01/22 0358  AST 48* 64*  ALT 24 26  ALKPHOS 86 85  BILITOT 0.5 0.6  PROT 6.2* 5.9*  ALBUMIN 3.0* 2.8*     Radiology Studies: DG CHEST PORT 1 VIEW  Result Date: 03/01/2022 CLINICAL DATA:  Shortness of breath. EXAM: PORTABLE CHEST 1 VIEW COMPARISON:  February 28, 2022 FINDINGS: The heart size and mediastinal contours are within normal limits. There is moderate severity calcification of the aortic arch. Calcified mediastinal lymph nodes are seen on the right. The lungs are hyperinflated. Both lungs are clear. A left breast prosthesis is noted. Degenerative changes are seen throughout the thoracic spine. IMPRESSION: 1. Stable exam without evidence of acute cardiopulmonary disease. Electronically Signed   By: Aram Candela M.D.   On: 03/01/2022 21:32   CT ANGIO GI BLEED  Result Date: 03/01/2022 CLINICAL DATA:  Lower GI bleed. EXAM: CTA ABDOMEN AND PELVIS WITHOUT AND WITH CONTRAST TECHNIQUE: Multidetector CT imaging of the abdomen and pelvis was performed using the standard protocol during bolus administration of intravenous contrast. Multiplanar reconstructed images and MIPs  were obtained and reviewed to evaluate the vascular anatomy. RADIATION DOSE REDUCTION: This exam was performed according to the departmental dose-optimization program which includes automated exposure control, adjustment of the mA and/or kV according to patient size and/or use of iterative reconstruction technique. CONTRAST:  OMNIPAQUE IOHEXOL 350 MG/ML SOLN COMPARISON:  None Available. FINDINGS: VASCULAR Aorta: Normal caliber aorta without aneurysm, dissection, vasculitis or significant stenosis. Tortuosity with moderate atherosclerosis. Celiac: Mild plaque at the origin, widely patent distally. No dissection or acute findings. SMA: Mild plaque at the origin, widely patent distally. No dissection or acute findings. Renals: Both renal arteries are patent without evidence of aneurysm, dissection, vasculitis, fibromuscular dysplasia or significant stenosis. IMA: Patent without evidence of aneurysm, dissection, vasculitis or significant stenosis. Inflow: Patent without evidence of aneurysm, dissection, vasculitis or significant stenosis. Proximal Outflow: Bilateral common femoral and visualized portions of the superficial and profunda femoral arteries are patent without evidence  of aneurysm, dissection, vasculitis or significant stenosis. Veins: No acute venous abnormality on venous phase imaging. Review of the MIP images confirms the above findings. NON-VASCULAR Lower chest: No basilar airspace disease or pleural effusion. Deflated right breast implant. Grossly intact left breast implant. Hepatobiliary: Heterogeneously enhancing large low-density central mass in the liver spans at least 10.1 cm. This is poorly defined, for example series 9, image 14 and series 7, image 14. Moderate gallbladder distension. Common bile duct is normal. There is mild central intrahepatic biliary ductal dilatation. Calcified granuloma in the right hepatic lobe. Arms down positioning and motion limits assessment. Pancreas: Not  well-defined on the current exam due to paucity of intra-abdominal fat, likely atrophic. Spleen: Normal in size. Nonspecific 13 mm low-density lesion in the posterior spleen. There are splenic granuloma. Adrenals/Urinary Tract: No definite adrenal nodule. No hydronephrosis. There are multiple right renal cysts. No further imaging follow-up is needed. No evidence of solid renal mass. Partially distended urinary bladder without wall thickening. Stomach/Bowel: There is no contrast extravasation into the GI tract to localize site of GI bleed. Moderate volume of stool throughout the colon. Please note that detailed bowel assessment is limited given paucity of intra-abdominal fat, streak artifact from arms down positioning and lack of enteric contrast. There is no bowel dilatation or obstruction. No gross bowel inflammation. The stomach is decompressed. Lymphatic: Assessment for adenopathy significantly limited. There are no obvious bulky enlarged nodes. Reproductive: The endometrium measures 7 mm, abnormal for age. No visible adnexal mass. Other: Generalized paucity of body fat suggesting cachexia. There is diffuse subcutaneous and intra-abdominal edema suggestive of third spacing. Possible trace perihepatic ascites. No free intra-abdominal air. Musculoskeletal: The bones are diffusely under mineralized. Diffuse fusion of the posterior elements presumably postsurgical. There is fusion of L1 and L2 vertebral bodies. No evidence of focal bone lesion. There is fatty atrophy of the paraspinal musculature. IMPRESSION: 1. No contrast extravasation into the GI tract to localize site of GI bleed. 2. Heterogeneously enhancing large low-density central mass in the liver spanning at least 10.1 cm. This is poorly defined. Findings are suspicious for metastatic disease or primary hepatobiliary malignancy. While typically MRI would be recommended for characterization, given patient's advanced age and motion on CT, this would be of  doubtful diagnostic value, recommend initial assessment with ultrasound. 3. The endometrium measures 7 mm, abnormal for age. Recommend nonemergent pelvic ultrasound. 4. Generalized paucity of body fat suggesting cachexia. Diffuse subcutaneous and intra-abdominal edema suggestive of third spacing. Possible trace perihepatic ascites. 5. Nonspecific 13 mm low-density lesion in the posterior spleen. Aortic Atherosclerosis (ICD10-I70.0). Electronically Signed   By: Narda Rutherford M.D.   On: 03/01/2022 18:14      LOS: 2 days    Joycelyn Das, MD [facility] Available via Epic secure chat 7am-7pm After these hours, please refer to coverage provider listed on [email or web address] 03/03/2022, 7:43 AM

## 2022-03-04 MED ORDER — GLYCOPYRROLATE 1 MG PO TABS: 1.0000 mg | ORAL_TABLET | ORAL | Status: DC | PRN

## 2022-03-04 MED ORDER — SENNOSIDES-DOCUSATE SODIUM 8.6-50 MG PO TABS: 1.0000 | ORAL_TABLET | Freq: Every evening | ORAL | 0 refills | Status: AC | PRN

## 2022-03-04 MED ORDER — ONDANSETRON 4 MG PO TBDP: 4.0000 mg | ORAL_TABLET | Freq: Four times a day (QID) | ORAL | Status: DC | PRN

## 2022-03-04 MED ORDER — PANTOPRAZOLE SODIUM 40 MG PO TBEC: 40.0000 mg | DELAYED_RELEASE_TABLET | Freq: Two times a day (BID) | ORAL | Status: DC

## 2022-03-04 MED ORDER — BISACODYL 10 MG RE SUPP: 10.0000 mg | Freq: Every day | RECTAL | 0 refills | Status: AC | PRN

## 2022-03-04 MED ORDER — LORAZEPAM 1 MG PO TABS: 1.0000 mg | ORAL_TABLET | ORAL | 0 refills | Status: AC | PRN

## 2022-03-04 MED ORDER — GLYCOPYRROLATE 1 MG PO TABS: 1.0000 mg | ORAL_TABLET | ORAL | 0 refills | Status: AC | PRN

## 2022-03-04 MED ORDER — LORAZEPAM 1 MG PO TABS: 1.0000 mg | ORAL_TABLET | ORAL | 0 refills | Status: DC | PRN

## 2022-03-04 MED ORDER — HALOPERIDOL 0.5 MG PO TABS: 0.5000 mg | ORAL_TABLET | ORAL | 0 refills | Status: AC | PRN

## 2022-03-04 MED ORDER — HALOPERIDOL 0.5 MG PO TABS: 0.5000 mg | ORAL_TABLET | ORAL | Status: DC | PRN

## 2022-03-04 MED ORDER — OXYCODONE HCL 20 MG/ML PO CONC: 6.0000 mg | ORAL | 0 refills | Status: AC | PRN

## 2022-03-04 MED ORDER — ONDANSETRON 4 MG PO TBDP: 4.0000 mg | ORAL_TABLET | Freq: Four times a day (QID) | ORAL | 0 refills | Status: AC | PRN

## 2022-03-04 MED ORDER — BISACODYL 10 MG RE SUPP: 10.0000 mg | Freq: Every day | RECTAL | 0 refills | Status: DC | PRN

## 2022-03-04 MED ORDER — OXYCODONE HCL 20 MG/ML PO CONC: 6.0000 mg | ORAL | 0 refills | Status: DC | PRN

## 2022-03-04 MED ORDER — SENNOSIDES-DOCUSATE SODIUM 8.6-50 MG PO TABS: 1.0000 | ORAL_TABLET | Freq: Every evening | ORAL | Status: DC | PRN

## 2022-03-04 MED ORDER — PANTOPRAZOLE SODIUM 40 MG PO TBEC: 40.0000 mg | DELAYED_RELEASE_TABLET | Freq: Two times a day (BID) | ORAL | 0 refills | Status: AC

## 2022-03-04 NOTE — Discharge Summary (Addendum)
Physician Discharge Summary  Sandra Jennings ZOX:[id number] DOB: [birth year] DOA: 02/28/2022  PCP: [facility]  Admit date: 02/28/2022 Discharge date: 03/04/2022  Admitted From: Home  Discharge disposition: Home with hospice   Recommendations for Outpatient Follow-Up:   Follow-up with hospice care provider as outpatient.  Discharge Diagnosis:   Principal Problem:   GI bleed Active Problems:   H/O diabetes mellitus   ANEMIA, IRON DEFICIENCY, UNSPEC.   HYPERTENSION, BENIGN SYSTEMIC   Pressure injury of skin   Symptomatic anemia   Liver mass  Discharge Condition: Stable  Diet recommendation: Soft diet  Wound care: None.  Code status: DNR  History of Present Illness:   Sandra Jennings is a 81 y.o. female with past medical history of dementia, hypertension, CVA with hypertension presented to the hospital with complaints of syncopal event. Found to have symptomatic anemia with hemoglobin of 5.7 received 3 units of packed RBC.  CT abdomen negative for any active bleeding but hepatic mass concerning for malignancy. GI consulted for complaints of melena. On further discussion with family with regards to goals of care decision was made to transition to complete comfort with DNR/DNI with goal to go home with hospice.   Hospital Course:   Following conditions were addressed during hospitalization as listed below,  Symptomatic anemia. Chronic upper GI bleed with melena Syncope Presented with syncope.  Initial hemoglobin was 5.7.  Received 3 units of packed RBC.  Latest hemoglobin at 11.1. Reticulocyte count normal.  INR 1.3.  Fibrinogen level normal.  B12 level 2000.  Bilirubin level normal. CT abdomen negative for any intra-abdominal bleeding. Patient does have history of chronic GI bleed. GI was consulted and initial plan was to consider an endoscopy but due to possibility of liver malignancy recommended to treat conservatively and preferentially  transition to comfort.  Patient is a poor candidate for endoscopy with high risk for poor outcomes.   Liver mass. CT angiogram showed evidence of a 10 cm liver mass concerning for liver malignancy.   Acute hypoxia, Transient and resolved.  Chest x-ray was unremarkable   Dementia/History of CVA. Patient is nonverbal at baseline.   Decreased oral intake, failure to thrive, underweight Per patient's family patient had been having poor oral intake and has been declining recently. Patient Body mass index is 16.92 kg/m.  Poor prognosis.  Stage II pressure ulceration to the sacrum and right hip.  Present on admission.  Wound care to follow the patient during hospitalization.  Continue local care..   Goals of care conversation. Previous provider had spoken with the patient's family plan is to transition to complete comfort care and home hospice on discharge.  Patient does have a history of dementia, poor p.o. intake and progressive decline for last few months with malnutrition and underlying liver mass putting her at high risk for poor outcomes.   Disposition.  At this time, patient is stable for disposition home with hospice   Medical Consultants:   GI  Procedures:    PRBC transfusion Subjective:   Today, patient patient was seen and examined at bedside.  Patient is nonverbal.  Slightly alert awake but unable to follow commands,  Discharge Exam:   Vitals:   03/04/22 0520 03/04/22 0800  BP: (!) 189/79 (!) 186/72  Pulse: (!) 59 (!) 56  Resp: 20 14  Temp: (!) 97.5 F (36.4 C) 97.7 F (36.5 C)  SpO2: 100% 100%   Vitals:   03/03/22 0347 03/03/22 1231 03/04/22 0520 03/04/22 0800  BP: (!) 150/84 (!) 184/92 (!) 189/79 (!) 186/72  Pulse: (!) 56 60 (!) 59 (!) 56  Resp: 12  20 14   Temp:  97.9 F (36.6 C) (!) 97.5 F (36.4 C) 97.7 F (36.5 C)  TempSrc:  Oral Oral Axillary  SpO2: (!) 85% 100% 100% 100%  Weight:      Height:       General: Alert awake, not in obvious distress  thinly built, nonverbal, elderly female, appears frail and deconditioned HENT: pupils equally reacting to light, mild pallor noted.  Oral mucosa is moist.  Chest:  Clear breath sounds.   No crackles or wheezes.  CVS: S1 &S2 heard. No murmur.  Regular rate and rhythm. Abdomen: Soft, nontender, nondistended.  Bowel sounds are heard.   Extremities: No cyanosis, clubbing or edema.  Peripheral pulses are palpable. Psych: Alert, awake but nonverbal, underlying dementia CNS: Moving extremities.  Nonverbal    Skin: Warm and dry.  Sacral pressure injury.  The results of significant diagnostics from this hospitalization (including imaging, microbiology, ancillary and laboratory) are listed below for reference.     Diagnostic Studies:   DG CHEST PORT 1 VIEW  Result Date: 03/01/2022 CLINICAL DATA:  Shortness of breath. EXAM: PORTABLE CHEST 1 VIEW COMPARISON:  February 28, 2022 FINDINGS: The heart size and mediastinal contours are within normal limits. There is moderate severity calcification of the aortic arch. Calcified mediastinal lymph nodes are seen on the right. The lungs are hyperinflated. Both lungs are clear. A left breast prosthesis is noted. Degenerative changes are seen throughout the thoracic spine. IMPRESSION: 1. Stable exam without evidence of acute cardiopulmonary disease. Electronically Signed   By: March 02, 2022 M.D.   On: 03/01/2022 21:32   CT ANGIO GI BLEED  Result Date: 03/01/2022 CLINICAL DATA:  Lower GI bleed. EXAM: CTA ABDOMEN AND PELVIS WITHOUT AND WITH CONTRAST TECHNIQUE: Multidetector CT imaging of the abdomen and pelvis was performed using the standard protocol during bolus administration of intravenous contrast. Multiplanar reconstructed images and MIPs were obtained and reviewed to evaluate the vascular anatomy. RADIATION DOSE REDUCTION: This exam was performed according to the departmental dose-optimization program which includes automated exposure control, adjustment of the  mA and/or kV according to patient size and/or use of iterative reconstruction technique. CONTRAST:  13/09/2021 OMNIPAQUE IOHEXOL 350 MG/ML SOLN COMPARISON:  None Available. FINDINGS: VASCULAR Aorta: Normal caliber aorta without aneurysm, dissection, vasculitis or significant stenosis. Tortuosity with moderate atherosclerosis. Celiac: Mild plaque at the origin, widely patent distally. No dissection or acute findings. SMA: Mild plaque at the origin, widely patent distally. No dissection or acute findings. Renals: Both renal arteries are patent without evidence of aneurysm, dissection, vasculitis, fibromuscular dysplasia or significant stenosis. IMA: Patent without evidence of aneurysm, dissection, vasculitis or significant stenosis. Inflow: Patent without evidence of aneurysm, dissection, vasculitis or significant stenosis. Proximal Outflow: Bilateral common femoral and visualized portions of the superficial and profunda femoral arteries are patent without evidence of aneurysm, dissection, vasculitis or significant stenosis. Veins: No acute venous abnormality on venous phase imaging. Review of the MIP images confirms the above findings. NON-VASCULAR Lower chest: No basilar airspace disease or pleural effusion. Deflated right breast implant. Grossly intact left breast implant. Hepatobiliary: Heterogeneously enhancing large low-density central mass in the liver spans at least 10.1 cm. This is poorly defined, for example series 9, image 14 and series 7, image 14. Moderate gallbladder distension. Common bile duct is normal. There is mild central intrahepatic biliary ductal dilatation. Calcified granuloma in the right  hepatic lobe. Arms down positioning and motion limits assessment. Pancreas: Not well-defined on the current exam due to paucity of intra-abdominal fat, likely atrophic. Spleen: Normal in size. Nonspecific 13 mm low-density lesion in the posterior spleen. There are splenic granuloma. Adrenals/Urinary Tract: No  definite adrenal nodule. No hydronephrosis. There are multiple right renal cysts. No further imaging follow-up is needed. No evidence of solid renal mass. Partially distended urinary bladder without wall thickening. Stomach/Bowel: There is no contrast extravasation into the GI tract to localize site of GI bleed. Moderate volume of stool throughout the colon. Please note that detailed bowel assessment is limited given paucity of intra-abdominal fat, streak artifact from arms down positioning and lack of enteric contrast. There is no bowel dilatation or obstruction. No gross bowel inflammation. The stomach is decompressed. Lymphatic: Assessment for adenopathy significantly limited. There are no obvious bulky enlarged nodes. Reproductive: The endometrium measures 7 mm, abnormal for age. No visible adnexal mass. Other: Generalized paucity of body fat suggesting cachexia. There is diffuse subcutaneous and intra-abdominal edema suggestive of third spacing. Possible trace perihepatic ascites. No free intra-abdominal air. Musculoskeletal: The bones are diffusely under mineralized. Diffuse fusion of the posterior elements presumably postsurgical. There is fusion of L1 and L2 vertebral bodies. No evidence of focal bone lesion. There is fatty atrophy of the paraspinal musculature. IMPRESSION: 1. No contrast extravasation into the GI tract to localize site of GI bleed. 2. Heterogeneously enhancing large low-density central mass in the liver spanning at least 10.1 cm. This is poorly defined. Findings are suspicious for metastatic disease or primary hepatobiliary malignancy. While typically MRI would be recommended for characterization, given patient's advanced age and motion on CT, this would be of doubtful diagnostic value, recommend initial assessment with ultrasound. 3. The endometrium measures 7 mm, abnormal for age. Recommend nonemergent pelvic ultrasound. 4. Generalized paucity of body fat suggesting cachexia. Diffuse  subcutaneous and intra-abdominal edema suggestive of third spacing. Possible trace perihepatic ascites. 5. Nonspecific 13 mm low-density lesion in the posterior spleen. Aortic Atherosclerosis (ICD10-I70.0). Electronically Signed   By: Narda RutherfordMelanie  Sanford M.D.   On: 03/01/2022 18:14   CT HEAD WO CONTRAST (5MM)  Result Date: 02/28/2022 CLINICAL DATA:  81 year old female with altered mental status. EXAM: CT HEAD WITHOUT CONTRAST TECHNIQUE: Contiguous axial images were obtained from the base of the skull through the vertex without intravenous contrast. RADIATION DOSE REDUCTION: This exam was performed according to the departmental dose-optimization program which includes automated exposure control, adjustment of the mA and/or kV according to patient size and/or use of iterative reconstruction technique. COMPARISON:  02/22/2013 MR FINDINGS: Brain: No evidence of acute infarction, hemorrhage, extra-axial collection or mass lesion/mass effect. Mild ventriculomegaly has increased since 2014 and may be related to central atrophy/chronic white matter changes. Hydrocephalus is difficult to entirely exclude. Atrophy and severe chronic small-vessel white matter ischemic changes again noted. Vascular: Carotid and vertebral atherosclerotic calcifications are noted. Skull: Normal. Negative for fracture or focal lesion. Sinuses/Orbits: No acute finding. Other: None IMPRESSION: 1. Mild ventriculomegaly which is more likely related to central atrophy/chronic white matter changes, but mild communicating hydrocephalus this difficult to entirely exclude. 2. No other acute abnormalities noted. 3. Atrophy and severe chronic small-vessel white matter ischemic changes. Electronically Signed   By: Harmon PierJeffrey  Hu M.D.   On: 02/28/2022 14:18   DG Chest Portable 1 View  Result Date: 02/28/2022 CLINICAL DATA:  Altered mental status. EXAM: PORTABLE CHEST 1 VIEW COMPARISON:  03/03/2020 chest radiograph FINDINGS: The cardiomediastinal silhouette  is not  significantly changed. There is no evidence of focal airspace disease, pulmonary edema, suspicious pulmonary nodule/mass, pleural effusion, or pneumothorax. No acute bony abnormalities are identified. Density overlying the LEFT chest/breast again noted, question prosthesis. IMPRESSION: No active disease. Electronically Signed   By: Harmon Pier M.D.   On: 02/28/2022 14:13     Labs:   Basic Metabolic Panel: Recent Labs  Lab 02/28/22 1402 03/01/22 0358  NA 143 142  K 4.4 4.2  CL 114* 116*  CO2 23 20*  GLUCOSE 112* 85  BUN 36* 35*  CREATININE 1.22* 1.16*  CALCIUM 8.8* 8.6*   GFR Estimated Creatinine Clearance: 29.9 mL/min (A) (by C-G formula based on SCr of 1.16 mg/dL (H)). Liver Function Tests: Recent Labs  Lab 02/28/22 1402 03/01/22 0358  AST 48* 64*  ALT 24 26  ALKPHOS 86 85  BILITOT 0.5 0.6  PROT 6.2* 5.9*  ALBUMIN 3.0* 2.8*   No results for input(s): "LIPASE", "AMYLASE" in the last 168 hours. No results for input(s): "AMMONIA" in the last 168 hours. Coagulation profile Recent Labs  Lab 03/01/22 0358 03/01/22 1731  INR 1.3* 1.3*    CBC: Recent Labs  Lab 02/28/22 1402 02/28/22 1955 03/01/22 0358 03/01/22 1051 03/01/22 1605 03/02/22 0453 03/02/22 2112  WBC 4.8 5.4 5.2  --  5.1  --   --   HGB 5.7* 5.4* 5.0* 6.6* 5.1* 6.9* 11.1*  HCT 19.4* 17.9* 16.1* 21.3* 16.3* 21.3* 32.9*  MCV 82.6 81.0 78.9*  --  80.3  --   --   PLT 220 197 213  --  190  --   --    Cardiac Enzymes: No results for input(s): "CKTOTAL", "CKMB", "CKMBINDEX", "TROPONINI" in the last 168 hours. BNP: Invalid input(s): "POCBNP" CBG: Recent Labs  Lab 03/01/22 1150 03/01/22 1812 03/02/22 0028 03/02/22 0513 03/02/22 1137  GLUCAP 71 75 79 101* 83   D-Dimer No results for input(s): "DDIMER" in the last 72 hours. Hgb A1c No results for input(s): "HGBA1C" in the last 72 hours. Lipid Profile No results for input(s): "CHOL", "HDL", "LDLCALC", "TRIG", "CHOLHDL", "LDLDIRECT" in the  last 72 hours. Thyroid function studies No results for input(s): "TSH", "T4TOTAL", "T3FREE", "THYROIDAB" in the last 72 hours.  Invalid input(s): "FREET3" Anemia work up Recent Labs    03/02/22 0453  VITAMINB12 2,093*  FOLATE 31.4  TIBC 202*  IRON 177*  RETICCTPCT 1.9   Microbiology No results found for this or any previous visit (from the past 240 hour(s)).   Discharge Instructions:   Discharge Instructions     Discharge instructions   Complete by: As directed    Further management as per hospice team.   Increase activity slowly   Complete by: As directed    No wound care   Complete by: As directed    Wound care to sacral and right hip pressure injuries, partial thickness, Stage 2 (POA): Cleanse with NS, pat dry. Cover with size appropriate piece of xeroform gauze, top with dry gauze 2x2 and cover with silicone foam. Change xeroform daily, may reuse silicone foam for up to 3 days. Change PRN soiling.  02/28/22 1958      Allergies as of 03/04/2022       Reactions   Ambien [zolpidem] Other (See Comments)   hallucinations        Medication List     TAKE these medications    acetaminophen 325 MG tablet Commonly known as: TYLENOL Take 650 mg by mouth 3 (three) times daily as needed.  bisacodyl 10 MG suppository Commonly known as: DULCOLAX Place 1 suppository (10 mg total) rectally daily as needed for moderate constipation.   cloNIDine 0.1 mg/24hr patch Commonly known as: CATAPRES - Dosed in mg/24 hr Place 0.1 mg onto the skin once a week.   glycopyrrolate 1 MG tablet Commonly known as: ROBINUL Take 1 tablet (1 mg total) by mouth every 4 (four) hours as needed (excessive secretions).   haloperidol 0.5 MG tablet Commonly known as: HALDOL Take 1 tablet (0.5 mg total) by mouth every 4 (four) hours as needed for agitation (or delirium).   LORazepam 1 MG tablet Commonly known as: ATIVAN Take 1 tablet (1 mg total) by mouth every 4 (four) hours as needed for  anxiety.   mirtazapine 7.5 MG tablet Commonly known as: REMERON Take 7.5 mg by mouth at bedtime.   ondansetron 4 MG disintegrating tablet Commonly known as: ZOFRAN-ODT Take 1 tablet (4 mg total) by mouth every 6 (six) hours as needed for nausea.   oxyCODONE 20 MG/ML concentrated solution Commonly known as: ROXICODONE INTENSOL Take 0.3 mLs (6 mg total) by mouth every 2 (two) hours as needed for moderate pain (or dyspnea).   pantoprazole 40 MG tablet Commonly known as: Protonix Take 1 tablet (40 mg total) by mouth 2 (two) times daily before a meal.   senna-docusate 8.6-50 MG tablet Commonly known as: Senokot-S Take 1 tablet by mouth at bedtime as needed for mild constipation.   traZODone 100 MG tablet Commonly known as: DESYREL Take 200 mg by mouth at bedtime.   vitamin E 180 MG (400 UNITS) capsule Take 400 Units by mouth daily.   Vitron-C 65-125 MG Tabs Generic drug: Iron-Vitamin C Take 1 tablet by mouth in the morning and at bedtime.        Time coordinating discharge: 39 minutes  Signed:  Ashelyn Mccravy  [facility] 03/04/2022, 11:16 AM

## 2022-03-04 NOTE — TOC Progression Note (Addendum)
Transition of Care [facility]) - Progression Note    Patient Details  Name: Sandra Jennings MRN: [id number] Date of Birth: January 22, 1941  [facility] CM/SW Contact  Golda Acre, RN Phone Number: 03/04/2022, 9:26 AM  Clinical Narrative:    Bertram Gala [facility].  Message left for her to call my phone back. Tct-son wants patient to be transported by ambulance.  Expected Discharge Plan: Home/Self Care Barriers to Discharge: Continued Medical Work up  Expected Discharge Plan and Services Expected Discharge Plan: Home/Self Care   Discharge Planning Services: CM Consult   Living arrangements for the past 2 months: Single Family Home                                       Social Determinants of Health (SDOH) Interventions    Readmission Risk Interventions   No data to display

## 2022-03-04 NOTE — TOC Transition Note (Addendum)
Transition of Care [facility]) - CM/SW Discharge Note   Patient Details  Name: Sandra Jennings MRN: [id number] Date of Birth: 09/06/1940  Transition of Care [facility]) CM/SW Contact:  Golda Acre, RN Phone Number: 03/04/2022, 10:28 AM   Clinical Narrative:    Patient dcd to to return home and the [facility].  [facility]ospice per Galen Manila at [facility] will be done through the program.  Per the son and the legal guardian she is to be transported home via ambulance.  [facility] called at [phone].  Transport packet to the nursing station clerk.     Barriers to Discharge: Continued Medical Work up   Patient Goals and CMS Choice Patient states their goals for this hospitalization and ongoing recovery are:: unable to state CMS Medicare.gov Compare Post Acute Care list provided to:: Legal Guardian Choice offered to / list presented to : [facility] POA / Guardian  Discharge Placement                       Discharge Plan and Services   Discharge Planning Services: CM Consult                                 Social Determinants of Health (SDOH) Interventions     Readmission Risk Interventions   No data to display

## 2022-06-25 DEATH — deceased
